# Patient Record
Sex: Female | Born: 1975 | Hispanic: Yes | Marital: Married | Smoking: Never smoker
Health system: Southern US, Community
[De-identification: ages and names within clinical notes are randomized; demographics above are authoritative.]

## PROBLEM LIST (undated history)

## (undated) ENCOUNTER — Inpatient Hospital Stay (HOSPITAL_COMMUNITY): Payer: Self-pay

## (undated) DIAGNOSIS — E039 Hypothyroidism, unspecified: Secondary | ICD-10-CM

---

## 1999-05-10 ENCOUNTER — Ambulatory Visit (HOSPITAL_COMMUNITY): Admission: RE | Admit: 1999-05-10 | Discharge: 1999-05-10 | Payer: Self-pay | Admitting: *Deleted

## 1999-09-18 ENCOUNTER — Encounter: Payer: Self-pay | Admitting: *Deleted

## 1999-09-18 ENCOUNTER — Inpatient Hospital Stay (HOSPITAL_COMMUNITY): Admission: AD | Admit: 1999-09-18 | Discharge: 1999-09-23 | Payer: Self-pay | Admitting: *Deleted

## 2000-03-03 ENCOUNTER — Encounter (INDEPENDENT_AMBULATORY_CARE_PROVIDER_SITE_OTHER): Payer: Self-pay

## 2000-03-03 ENCOUNTER — Other Ambulatory Visit: Admission: RE | Admit: 2000-03-03 | Discharge: 2000-03-03 | Payer: Self-pay | Admitting: Obstetrics

## 2000-07-07 ENCOUNTER — Encounter: Admission: RE | Admit: 2000-07-07 | Discharge: 2000-07-07 | Payer: Self-pay | Admitting: Obstetrics & Gynecology

## 2000-08-04 ENCOUNTER — Other Ambulatory Visit: Admission: RE | Admit: 2000-08-04 | Discharge: 2000-08-04 | Payer: Self-pay | Admitting: *Deleted

## 2000-08-04 ENCOUNTER — Encounter: Admission: RE | Admit: 2000-08-04 | Discharge: 2000-08-04 | Payer: Self-pay | Admitting: Obstetrics

## 2000-08-18 ENCOUNTER — Encounter: Admission: RE | Admit: 2000-08-18 | Discharge: 2000-08-18 | Payer: Self-pay | Admitting: Obstetrics & Gynecology

## 2000-09-15 ENCOUNTER — Encounter: Admission: RE | Admit: 2000-09-15 | Discharge: 2000-09-15 | Payer: Self-pay | Admitting: Obstetrics & Gynecology

## 2000-12-29 ENCOUNTER — Encounter: Admission: RE | Admit: 2000-12-29 | Discharge: 2000-12-29 | Payer: Self-pay | Admitting: Obstetrics & Gynecology

## 2002-08-26 ENCOUNTER — Encounter: Payer: Self-pay | Admitting: Family Medicine

## 2002-08-26 ENCOUNTER — Ambulatory Visit (HOSPITAL_COMMUNITY): Admission: RE | Admit: 2002-08-26 | Discharge: 2002-08-26 | Payer: Self-pay | Admitting: Family Medicine

## 2003-04-04 ENCOUNTER — Ambulatory Visit (HOSPITAL_COMMUNITY): Admission: RE | Admit: 2003-04-04 | Discharge: 2003-04-04 | Payer: Self-pay | Admitting: Obstetrics and Gynecology

## 2003-09-15 ENCOUNTER — Ambulatory Visit (HOSPITAL_COMMUNITY): Admission: RE | Admit: 2003-09-15 | Discharge: 2003-09-15 | Payer: Self-pay | Admitting: Family Medicine

## 2004-11-16 ENCOUNTER — Inpatient Hospital Stay (HOSPITAL_COMMUNITY): Admission: AD | Admit: 2004-11-16 | Discharge: 2004-11-18 | Payer: Self-pay | Admitting: Obstetrics

## 2009-05-18 ENCOUNTER — Ambulatory Visit (HOSPITAL_COMMUNITY): Admission: RE | Admit: 2009-05-18 | Discharge: 2009-05-18 | Payer: Self-pay | Admitting: Obstetrics & Gynecology

## 2009-06-08 ENCOUNTER — Ambulatory Visit (HOSPITAL_COMMUNITY): Admission: RE | Admit: 2009-06-08 | Discharge: 2009-06-08 | Payer: Self-pay | Admitting: Obstetrics & Gynecology

## 2009-06-18 ENCOUNTER — Ambulatory Visit: Payer: Self-pay | Admitting: Obstetrics and Gynecology

## 2009-06-22 ENCOUNTER — Encounter: Admission: RE | Admit: 2009-06-22 | Discharge: 2009-09-20 | Payer: Self-pay | Admitting: Obstetrics and Gynecology

## 2009-06-22 ENCOUNTER — Ambulatory Visit: Payer: Self-pay | Admitting: Obstetrics & Gynecology

## 2009-06-29 ENCOUNTER — Ambulatory Visit: Payer: Self-pay | Admitting: Obstetrics & Gynecology

## 2009-07-06 ENCOUNTER — Ambulatory Visit: Payer: Self-pay | Admitting: Obstetrics & Gynecology

## 2009-07-13 ENCOUNTER — Ambulatory Visit: Payer: Self-pay | Admitting: Obstetrics & Gynecology

## 2009-07-20 ENCOUNTER — Ambulatory Visit: Payer: Self-pay | Admitting: Obstetrics & Gynecology

## 2009-07-27 ENCOUNTER — Ambulatory Visit: Payer: Self-pay | Admitting: Obstetrics & Gynecology

## 2009-07-27 LAB — CONVERTED CEMR LAB
Hemoglobin: 11.1 g/dL — ABNORMAL LOW (ref 12.0–15.0)
RBC: 3.93 M/uL (ref 3.87–5.11)
RDW: 14.3 % (ref 11.5–15.5)
WBC: 9.8 10*3/uL (ref 4.0–10.5)

## 2009-08-03 ENCOUNTER — Ambulatory Visit: Payer: Self-pay | Admitting: Obstetrics & Gynecology

## 2009-08-04 ENCOUNTER — Ambulatory Visit (HOSPITAL_COMMUNITY): Admission: RE | Admit: 2009-08-04 | Discharge: 2009-08-04 | Payer: Self-pay

## 2009-08-10 ENCOUNTER — Ambulatory Visit: Payer: Self-pay | Admitting: Obstetrics & Gynecology

## 2009-08-17 ENCOUNTER — Ambulatory Visit: Payer: Self-pay | Admitting: Obstetrics & Gynecology

## 2009-08-24 ENCOUNTER — Ambulatory Visit: Payer: Self-pay | Admitting: Obstetrics & Gynecology

## 2009-08-31 ENCOUNTER — Ambulatory Visit: Payer: Self-pay | Admitting: Obstetrics & Gynecology

## 2009-09-03 ENCOUNTER — Ambulatory Visit: Payer: Self-pay | Admitting: Obstetrics & Gynecology

## 2009-09-07 ENCOUNTER — Ambulatory Visit: Payer: Self-pay | Admitting: Obstetrics and Gynecology

## 2009-09-10 ENCOUNTER — Ambulatory Visit: Payer: Self-pay | Admitting: Obstetrics & Gynecology

## 2009-09-14 ENCOUNTER — Ambulatory Visit: Payer: Self-pay | Admitting: Obstetrics & Gynecology

## 2009-09-14 ENCOUNTER — Ambulatory Visit (HOSPITAL_COMMUNITY): Admission: RE | Admit: 2009-09-14 | Discharge: 2009-09-14 | Payer: Self-pay | Admitting: Obstetrics & Gynecology

## 2009-09-17 ENCOUNTER — Ambulatory Visit: Payer: Self-pay | Admitting: Obstetrics & Gynecology

## 2009-09-22 ENCOUNTER — Ambulatory Visit: Payer: Self-pay | Admitting: Obstetrics & Gynecology

## 2009-09-24 ENCOUNTER — Ambulatory Visit: Payer: Self-pay | Admitting: Obstetrics & Gynecology

## 2009-09-28 ENCOUNTER — Encounter: Admission: RE | Admit: 2009-09-28 | Discharge: 2009-09-28 | Payer: Self-pay | Admitting: Obstetrics & Gynecology

## 2009-09-28 ENCOUNTER — Encounter (INDEPENDENT_AMBULATORY_CARE_PROVIDER_SITE_OTHER): Payer: Self-pay | Admitting: *Deleted

## 2009-09-28 ENCOUNTER — Ambulatory Visit: Payer: Self-pay | Admitting: Obstetrics & Gynecology

## 2009-09-28 LAB — CONVERTED CEMR LAB
Chlamydia, DNA Probe: NEGATIVE
GC Probe Amp, Genital: NEGATIVE

## 2009-10-01 ENCOUNTER — Ambulatory Visit: Payer: Self-pay | Admitting: Obstetrics and Gynecology

## 2009-10-05 ENCOUNTER — Ambulatory Visit: Payer: Self-pay | Admitting: Obstetrics & Gynecology

## 2009-10-05 ENCOUNTER — Ambulatory Visit (HOSPITAL_COMMUNITY): Admission: RE | Admit: 2009-10-05 | Discharge: 2009-10-05 | Payer: Self-pay | Admitting: Obstetrics & Gynecology

## 2009-10-08 ENCOUNTER — Ambulatory Visit: Payer: Self-pay | Admitting: Obstetrics & Gynecology

## 2009-10-12 ENCOUNTER — Ambulatory Visit (HOSPITAL_COMMUNITY): Admission: RE | Admit: 2009-10-12 | Discharge: 2009-10-12 | Payer: Self-pay | Admitting: Family Medicine

## 2009-10-12 ENCOUNTER — Ambulatory Visit: Payer: Self-pay | Admitting: Obstetrics & Gynecology

## 2009-10-13 ENCOUNTER — Ambulatory Visit: Payer: Self-pay | Admitting: Family Medicine

## 2009-10-13 ENCOUNTER — Inpatient Hospital Stay (HOSPITAL_COMMUNITY): Admission: RE | Admit: 2009-10-13 | Discharge: 2009-10-14 | Payer: Self-pay | Admitting: Obstetrics and Gynecology

## 2009-11-23 ENCOUNTER — Encounter (INDEPENDENT_AMBULATORY_CARE_PROVIDER_SITE_OTHER): Payer: Self-pay | Admitting: Nurse Practitioner

## 2010-05-16 ENCOUNTER — Encounter: Payer: Self-pay | Admitting: Family Medicine

## 2010-07-12 LAB — CBC
HCT: 31.6 % — ABNORMAL LOW (ref 36.0–46.0)
HCT: 36.3 % (ref 36.0–46.0)
Hemoglobin: 11.1 g/dL — ABNORMAL LOW (ref 12.0–15.0)
MCHC: 35.2 g/dL (ref 30.0–36.0)
MCV: 87 fL (ref 78.0–100.0)
RBC: 3.64 MIL/uL — ABNORMAL LOW (ref 3.87–5.11)
RBC: 4.22 MIL/uL (ref 3.87–5.11)
RDW: 14.8 % (ref 11.5–15.5)
WBC: 10.8 10*3/uL — ABNORMAL HIGH (ref 4.0–10.5)
WBC: 13.5 10*3/uL — ABNORMAL HIGH (ref 4.0–10.5)

## 2010-07-12 LAB — POCT URINALYSIS DIP (DEVICE)
Bilirubin Urine: NEGATIVE
Bilirubin Urine: NEGATIVE
Bilirubin Urine: NEGATIVE
Hgb urine dipstick: NEGATIVE
Hgb urine dipstick: NEGATIVE
Hgb urine dipstick: NEGATIVE
Ketones, ur: NEGATIVE mg/dL
Ketones, ur: NEGATIVE mg/dL
Ketones, ur: NEGATIVE mg/dL
Nitrite: NEGATIVE
Nitrite: NEGATIVE
Nitrite: NEGATIVE
Nitrite: NEGATIVE
Nitrite: NEGATIVE
Protein, ur: 30 mg/dL — AB
Protein, ur: NEGATIVE mg/dL
Protein, ur: NEGATIVE mg/dL
Protein, ur: NEGATIVE mg/dL
Specific Gravity, Urine: 1.015 (ref 1.005–1.030)
Specific Gravity, Urine: 1.025 (ref 1.005–1.030)
Specific Gravity, Urine: 1.02 (ref 1.005–1.030)
Urobilinogen, UA: 0.2 mg/dL (ref 0.0–1.0)
Urobilinogen, UA: 0.2 mg/dL (ref 0.0–1.0)
Urobilinogen, UA: 0.2 mg/dL (ref 0.0–1.0)
Urobilinogen, UA: 0.2 mg/dL (ref 0.0–1.0)
pH: 7 (ref 5.0–8.0)
pH: 7 (ref 5.0–8.0)

## 2010-07-12 LAB — GLUCOSE, CAPILLARY
Glucose-Capillary: 87 mg/dL (ref 70–99)
Glucose-Capillary: 92 mg/dL (ref 70–99)

## 2010-07-13 LAB — POCT URINALYSIS DIP (DEVICE)
Bilirubin Urine: NEGATIVE
Bilirubin Urine: NEGATIVE
Glucose, UA: NEGATIVE mg/dL
Glucose, UA: NEGATIVE mg/dL
Hgb urine dipstick: NEGATIVE
Ketones, ur: NEGATIVE mg/dL
Ketones, ur: NEGATIVE mg/dL
Nitrite: NEGATIVE
Nitrite: NEGATIVE
Nitrite: NEGATIVE
Protein, ur: NEGATIVE mg/dL
Protein, ur: NEGATIVE mg/dL
Specific Gravity, Urine: 1.025 (ref 1.005–1.030)
Specific Gravity, Urine: 1.02 (ref 1.005–1.030)
Urobilinogen, UA: 0.2 mg/dL (ref 0.0–1.0)
Urobilinogen, UA: 0.2 mg/dL (ref 0.0–1.0)
Urobilinogen, UA: 0.2 mg/dL (ref 0.0–1.0)
pH: 6.5 (ref 5.0–8.0)
pH: 6.5 (ref 5.0–8.0)
pH: 7 (ref 5.0–8.0)

## 2010-07-14 LAB — POCT URINALYSIS DIP (DEVICE)
Bilirubin Urine: NEGATIVE
Glucose, UA: NEGATIVE mg/dL
Nitrite: NEGATIVE
Nitrite: NEGATIVE
Protein, ur: NEGATIVE mg/dL
Urobilinogen, UA: 0.2 mg/dL (ref 0.0–1.0)
pH: 7 (ref 5.0–8.0)

## 2010-07-16 LAB — POCT URINALYSIS DIP (DEVICE)
Bilirubin Urine: NEGATIVE
Bilirubin Urine: NEGATIVE
Glucose, UA: NEGATIVE mg/dL
Hgb urine dipstick: NEGATIVE
Protein, ur: 30 mg/dL — AB
Specific Gravity, Urine: 1.015 (ref 1.005–1.030)
Urobilinogen, UA: 0.2 mg/dL (ref 0.0–1.0)
Urobilinogen, UA: 0.2 mg/dL (ref 0.0–1.0)
pH: 7 (ref 5.0–8.0)

## 2010-07-19 LAB — POCT URINALYSIS DIP (DEVICE)
Bilirubin Urine: NEGATIVE
Bilirubin Urine: NEGATIVE
Glucose, UA: NEGATIVE mg/dL
Glucose, UA: NEGATIVE mg/dL
Hgb urine dipstick: NEGATIVE
Ketones, ur: NEGATIVE mg/dL
Nitrite: NEGATIVE
Nitrite: NEGATIVE
Protein, ur: NEGATIVE mg/dL
Protein, ur: NEGATIVE mg/dL
Specific Gravity, Urine: 1.02 (ref 1.005–1.030)

## 2010-07-21 ENCOUNTER — Encounter: Payer: Self-pay | Admitting: Nurse Practitioner

## 2010-07-21 ENCOUNTER — Encounter (INDEPENDENT_AMBULATORY_CARE_PROVIDER_SITE_OTHER): Payer: Self-pay | Admitting: Nurse Practitioner

## 2010-07-21 DIAGNOSIS — D179 Benign lipomatous neoplasm, unspecified: Secondary | ICD-10-CM | POA: Insufficient documentation

## 2010-07-21 DIAGNOSIS — K029 Dental caries, unspecified: Secondary | ICD-10-CM | POA: Insufficient documentation

## 2010-07-21 DIAGNOSIS — K649 Unspecified hemorrhoids: Secondary | ICD-10-CM | POA: Insufficient documentation

## 2010-07-27 NOTE — Assessment & Plan Note (Signed)
Summary: NEW - Establish care   Vital Signs:  Patient profile:   35 year old female Menstrual status:  regular LMP:     07/04/2010 Height:      61.5 inches Weight:      181.19 pounds BMI:     33.80 Temp:     98.8 degrees F oral Pulse rate:   83 / minute Pulse rhythm:   regular Resp:     18 per minute BP sitting:   121 / 78  (left arm) Cuff size:   regular  Vitals Entered By: Hale Drone CMA (July 21, 2010 3:43 PM)  Nutrition Counseling: Patient's BMI is greater than 25 and therefore counseled on weight management options. CC: Tooth pain x3 months... Needing a dental referrla... Lump between left shoulder and neck and is concerned... No pain associated... 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 Is Patient Diabetic? No Pain Assessment Patient in pain? yes       Does patient need assistance? Functional Status Self care Ambulation Normal LMP (date): 07/04/2010     Menstrual Status regular Enter LMP: 07/04/2010 Last PAP Result done at [facility]   CC:  Tooth pain x3 months... Needing a dental referrla... Lump between left shoulder and neck and is concerned... No pain associated... 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  History of Present Illness:  Pt into the office to establish care. No previous PCP No PMH   Dental problem - started 3 months ago Pt has gargled with alcohol to help with the pain Problem in in the upper left tooth which had a filing but it spontaneously came out Last dental appt was several years ago.   Pt is still breast feeding so she has not taken any meds by mouth  Habits & Providers  Alcohol-Tobacco-Diet     Alcohol drinks/day: 0     Tobacco Status: never  Exercise-Depression-Behavior     Does Patient Exercise: no     Have you felt down or hopeless? no     Have you felt little pleasure in things? no     Depression Counseling: not indicated; screening negative for depression     Drug Use: never  Current Medications (verified): 1)   None  Allergies (verified): No Known Drug Allergies  Social History: 3 children  living with boyfriend no tobacco  no ETOH  no drug useSmoking Status:  never Drug Use:  never Does Patient Exercise:  no  Review of Systems General:  Denies fever. CV:  Denies chest pain or discomfort. Resp:  Denies cough. GI:  Complains of constipation and hemorrhoids; denies abdominal pain, nausea, and vomiting. MS:  Denies joint pain. Derm:  area on back of neck present for many years. not getting larger pt just concerned about the dx.  Physical Exam  General:  alert.   Head:  normocephalic.   Mouth:  poor dentation - left upper dental caries Lungs:  normal breath sounds.   Heart:  normal rate and regular rhythm.   Abdomen:  normal bowel sounds.   Msk:  normal ROM.   Neurologic:  alert & oriented X3.   Skin:  color normal.   posterior neck with lipoma Psych:  Oriented X3.     Impression & Recommendations:  Problem # 1:  DENTAL CARIES (ICD-521.00) advised pt that dental clinic is not taking any referrals at this time will give amoxil and ibuprofen  pt instructed to find local dental care  Problem # 2:  HEMORRHOIDS (ICD-455.6) handout given advised pt  to eat more fiber rich foods  Problem # 3:  LIPOMA (ICD-214.9) handout given pt advised of dx  Complete Medication List: 1)  Yasmin 28 3-0.03 Mg Tabs (Drospirenone-ethinyl estradiol) .... Rx by [facility] 2)  Amoxicillin 500 Mg Caps (Amoxicillin) .... One capsule by mouth three times a day for infection 3)  Ibuprofen 800 Mg Tabs (Ibuprofen) .... One tablet by mouth two times a day as needed for pain  Patient Instructions: 1)  You will need to secure dental services. Our dental clinic is NOT accepting patients at this time.   2)  will treat with amoxil 500mg  by mouth three times a day for infection.  May take ibuprofen 800mg  by mouth two times a day as needed for pain.  Both should be safe in pregnancy. 3)  Need to use condoms while  taking antibiotics 4)  Lipoma - fat tissue on back of your neck 5)  Hemorrhoids - You need to eat high fiber diet to help with hemorrhoids.  You need to keep your stools soft Prescriptions: IBUPROFEN 800 MG TABS (IBUPROFEN) One tablet by mouth two times a day as needed for pain  #50 x 0   Entered and Authorized by:   Lehman Prom FNP   Signed by:   Lehman Prom FNP on 07/21/2010   Method used:   Print then Give to Patient   RxID:   [id number] AMOXICILLIN 500 MG CAPS (AMOXICILLIN) One capsule by mouth three times a day for infection  #30 x 0   Entered and Authorized by:   Lehman Prom FNP   Signed by:   Lehman Prom FNP on 07/21/2010   Method used:   Print then Give to Patient   RxID:   [id number]    Orders Added: 1)  New Patient Level III [40102]

## 2010-07-27 NOTE — Letter (Signed)
Summary: Handout Printed  Printed Handout:  - Lipoma (Fatty Tumor)

## 2010-07-27 NOTE — Letter (Signed)
Summary: Handout Printed  Printed Handout:  - Diet - High-Fiber 

## 2010-07-27 NOTE — Letter (Signed)
Summary: Handout Printed  Printed Handout:  - Hemorrhoids 

## 2010-08-15 IMAGING — US US OB FOLLOW-UP
1 series · 14 of 28 positions shown · non-contrast
Comparison: none

OBSTETRICAL ULTRASOUND:
 This ultrasound exam was performed in the [HOSPITAL] Ultrasound Department.  The OB US report was generated in the AS system, and faxed to the ordering physician.  This report is also available in [HOSPITAL]?s AccessANYware and in [REDACTED] PACS.

[Series 1: us ob follow up · 0.24mm/px · 14 of 39 slices shown]
[im 2/39]
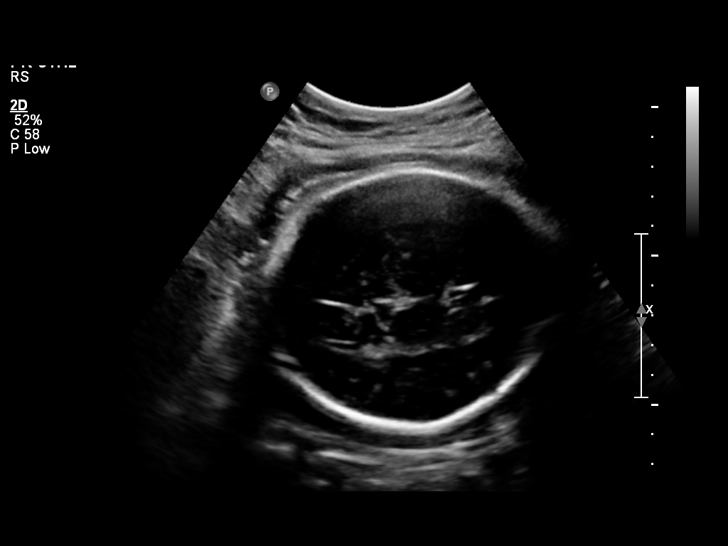
[im 5/39]
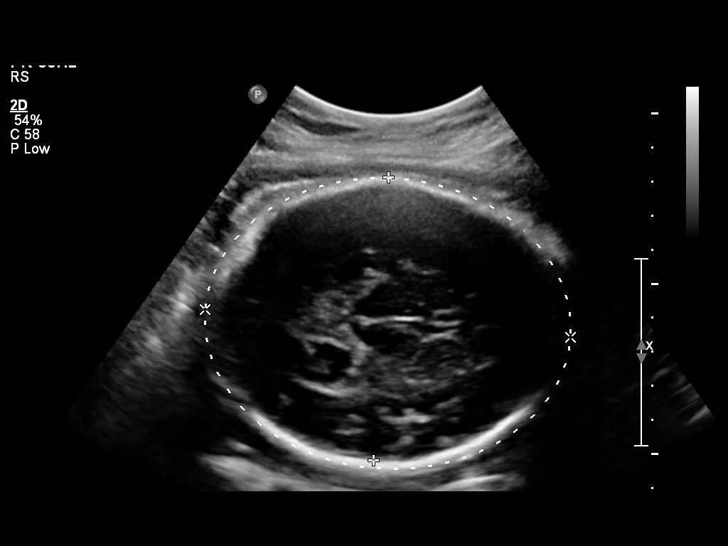
[im 8/39]
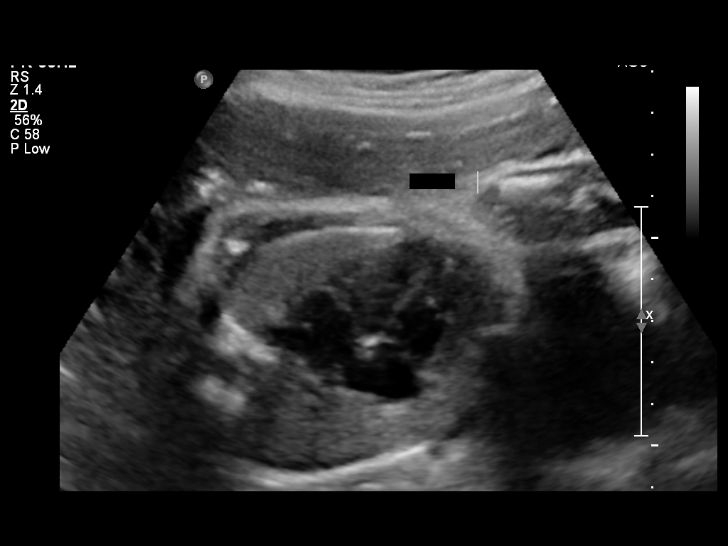
[im 10/39]
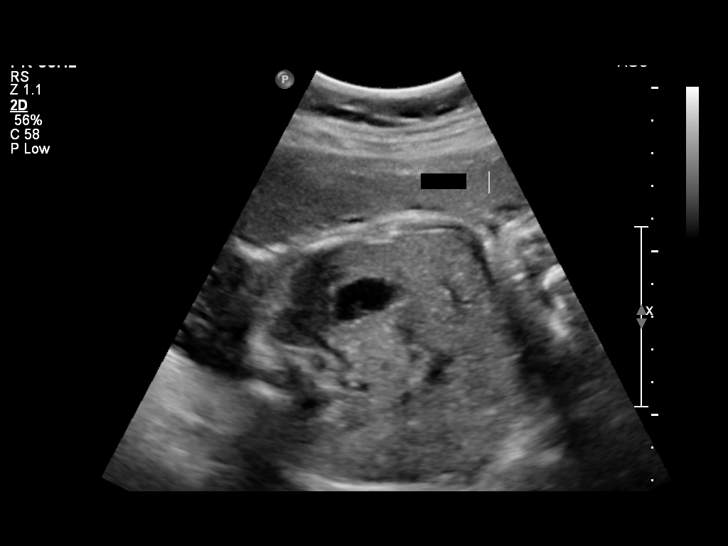
[im 13/39]
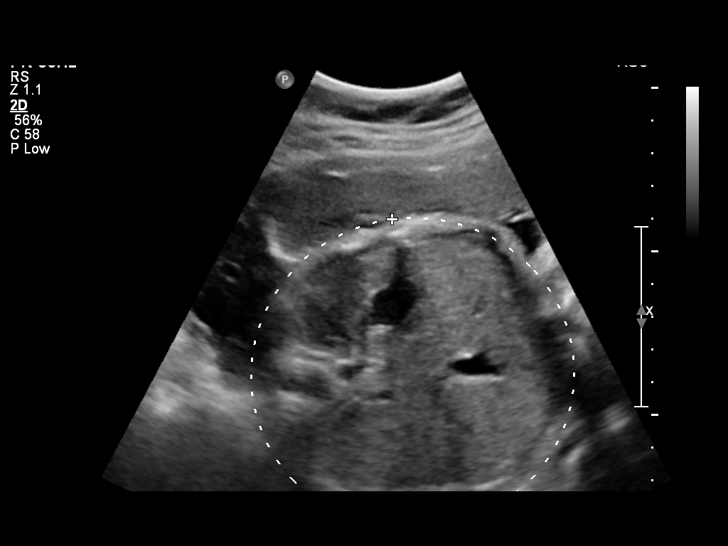
[im 16/39]
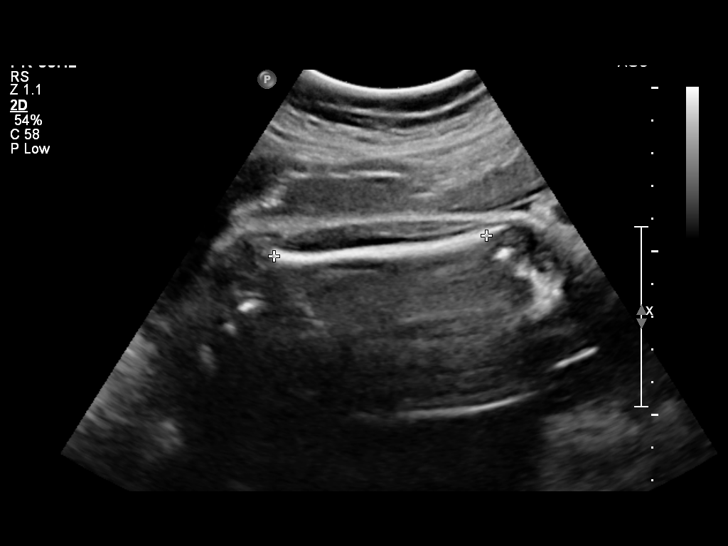
[im 19/39]
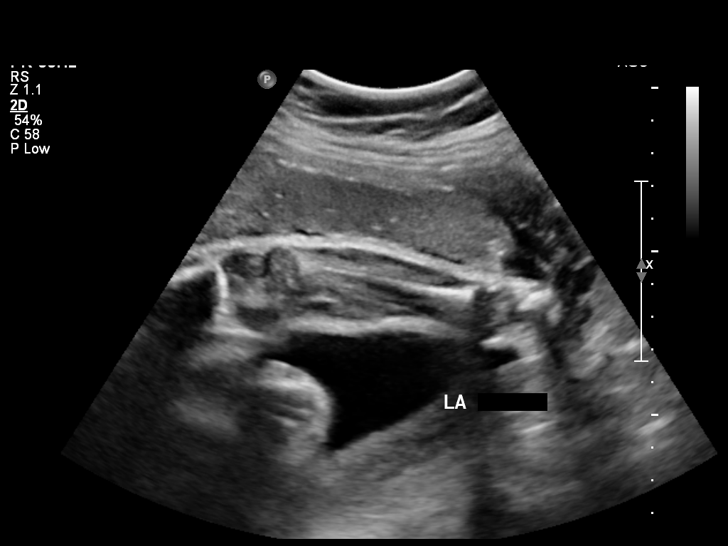
[im 22/39]
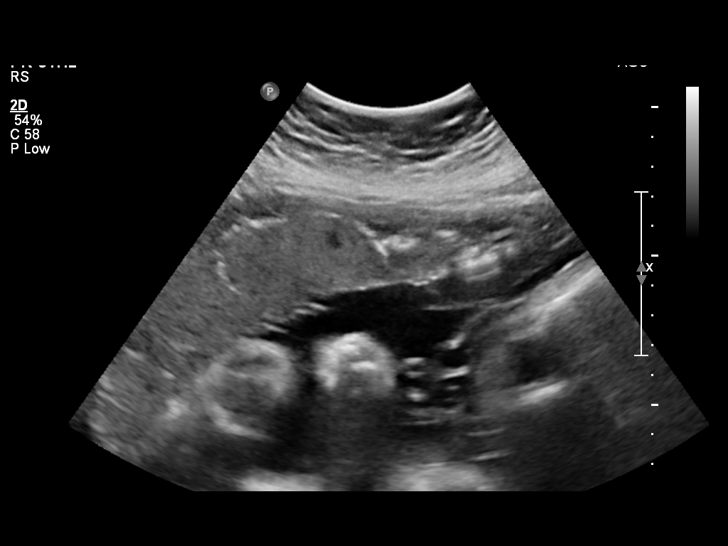
[im 24/39]
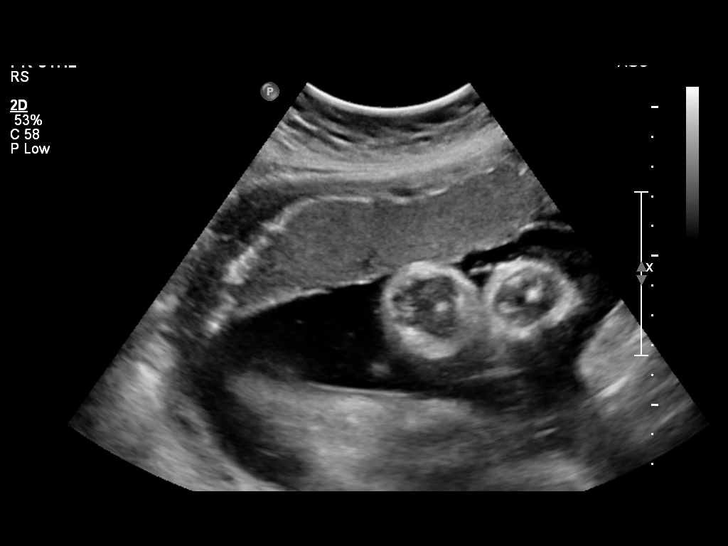
[im 27/39]
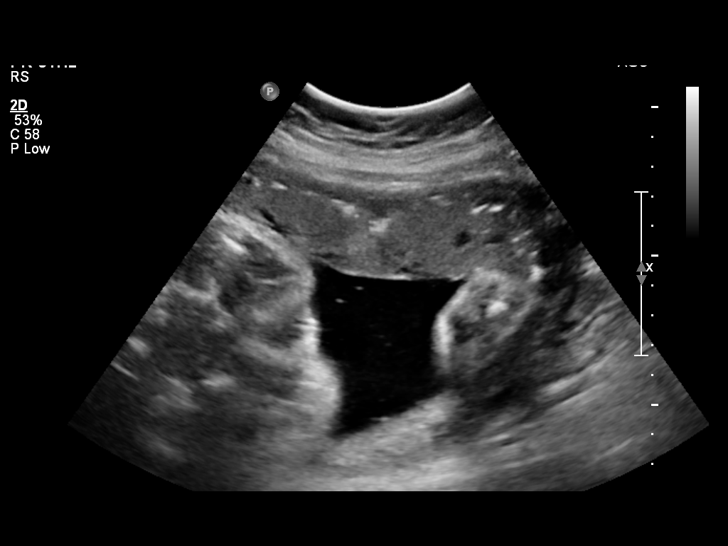
[im 30/39]
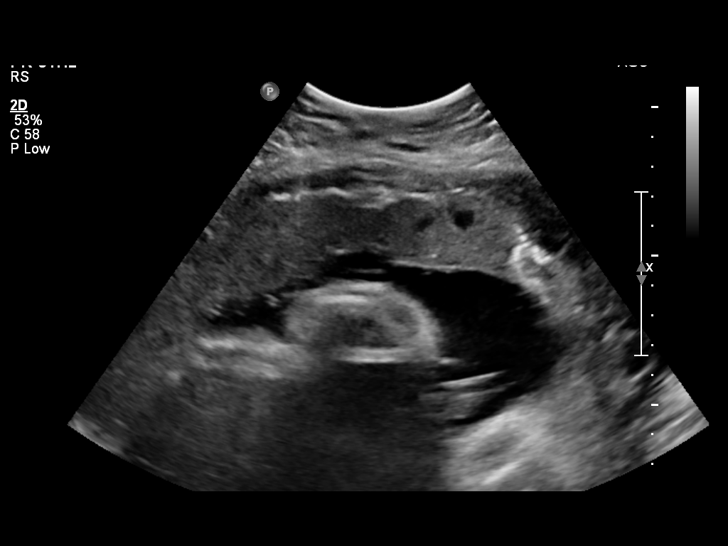
[im 33/39]
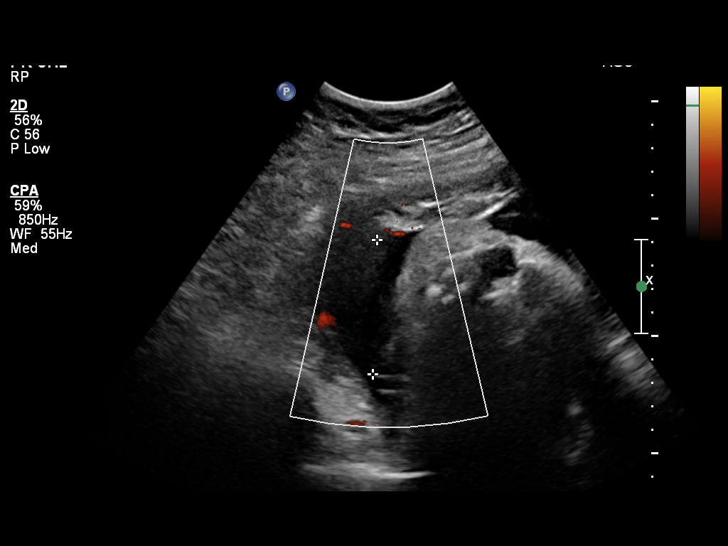
[im 36/39]
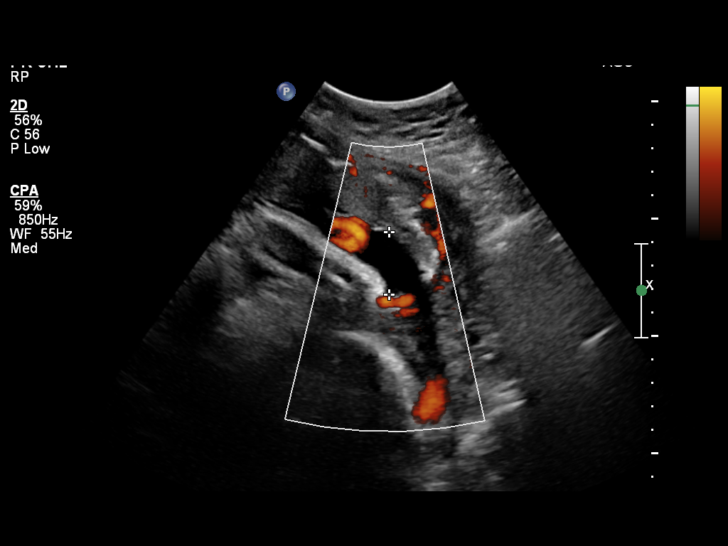
[im 39/39]
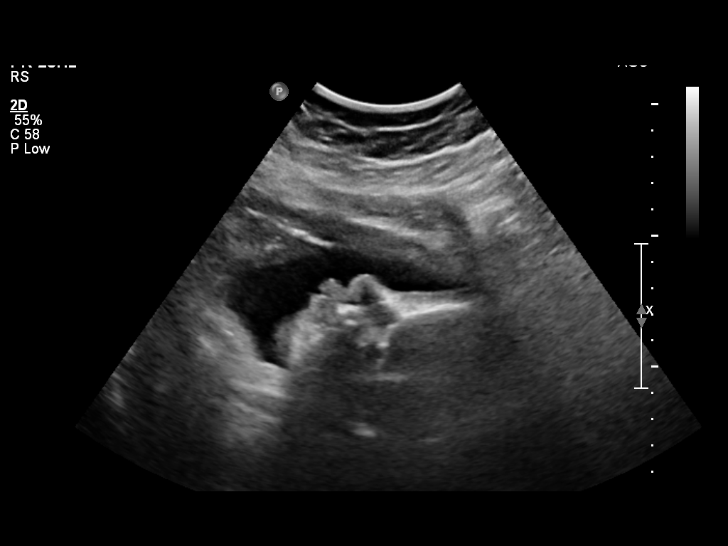

[14 of 28 positions shown; findings below may reference images not displayed]

IMPRESSION: See AS Obstetric US report.

## 2010-09-10 NOTE — Op Note (Signed)
Bianca Odonnell, Bianca Odonnell           ACCOUNT NO.:  [id number]   MEDICAL RECORD NO.:  [id number]          PATIENT TYPE:  INP   LOCATION:  9106                          FACILITY:  [facility]   PHYSICIAN:  Kathreen Cosier, M.D.DATE OF BIRTH:  1975/05/11   DATE OF PROCEDURE:  11/16/2004  DATE OF DISCHARGE:                                 OPERATIVE REPORT   PREOPERATIVE DIAGNOSIS:  Breech presentation at term, in labor, fully  dilated.   SURGEON:  Kathreen Cosier, M.D.   ANESTHESIA:  Spinal.   DESCRIPTION OF PROCEDURE:  The patient placed on the operating room in the  supine position after spinal was administered.  Abdomen prepped and draped.  Bladder emptied with Foley catheter.  Transverse suprapubic incision made,  carried down through the rectus fascia.  The fascia was cleaned and incised  the length of the incision.  Rectus muscle were retracted laterally.  The  peritoneum was incised longitudinally.  Transverse incision made in the  vesicouterine peritoneum above the bladder.  The bladder mobilized  inferiorly.  Transverse lower uterine incision made.  The patient was  delivered of a frank breech female, Apgars 8 and 0, weighing 7 pounds 3  ounces.  The placenta was posterior and removed manually. Uterine cavity was  cleaned with dry laps.  The team was in attendance.  The lap and sponge  counts were correct. Uterine cavity closed in one layer with continuous  suture of #1 chromic.  Hemostasis satisfactory.  Bladder flap reattached  with 2-0 chromic.  Uterus was well contracted.  Tubes and ovaries normal.  Abdomen closed in layers.  Peritoneum with continuous suture of 0 chromic,  fascia with continuous suture of 0 Dexon and skin closed with stables.  Estimated blood loss 600 mL.       BAM/MEDQ  D:  11/16/2004  T:  11/16/2004  Job:  [id number]

## 2010-09-10 NOTE — H&P (Signed)
NAMEELENNA, SPRATLING           ACCOUNT NO.:  [id number]   MEDICAL RECORD NO.:  [id number]          PATIENT TYPE:  INP   LOCATION:  9106                          FACILITY:  [facility]   PHYSICIAN:  Kathreen Cosier, M.D.DATE OF BIRTH:  03/24/1976   DATE OF ADMISSION:  11/16/2004  DATE OF DISCHARGE:                                HISTORY & PHYSICAL   HISTORY OF PRESENT ILLNESS:  The patient is a 35 year old gravida 2, para 0-  1-0-1, [facility] November 23, 2004 with gestational diabetic followed at [facility] with diet.  Sugars are reported as being normal.  She has negative  GBS.  The patient was admitted to the hospital, fully dilated and described  in MAU as a vertex presentation with membranes intact.  The patient was  taken to labor and delivery and I was then called for delivery.  However, at  that time it was discovered she was a frank breech with membranes intact.   The patient was then taken to the operating room for delivery of cesarean  section.   PHYSICAL EXAMINATION:  GENERAL:  Well-developed female in labor.  HEENT: Negative.  LUNGS:  Clear.  HEART:  Regular rhythm.  No murmurs, no gallops.  BREASTS: No masses.  ABDOMEN:  Term.  PELVIC:  As described above.  EXTREMITIES:  Negative.       BAM/MEDQ  D:  11/16/2004  T:  11/16/2004  Job:  [id number]

## 2010-09-10 NOTE — Discharge Summary (Signed)
NAMEJAPLEEN, TORNOW           ACCOUNT NO.:  [id number]   MEDICAL RECORD NO.:  [id number]          PATIENT TYPE:  INP   LOCATION:  9106                          FACILITY:  [facility]   PHYSICIAN:  Kathreen Cosier, M.D.DATE OF BIRTH:  [birth year]   DATE OF ADMISSION:  11/16/2004  DATE OF DISCHARGE:  11/18/2004                                 DISCHARGE SUMMARY   The patient is a 35 year old gravida 2, para 0-1-0-1, [facility] November 23, 2004,  gestational diabetic followed at [facility] with diet.  Sugars were  reported to be normal, negative GBS.  She was admitted, fully dilated and  described as a vertex presentation.  The patient taken to labor and delivery  and she was discovered to be breech, fully dilated, at +2 station.  The  patient was taken to the operating room and delivered by low transverse  cesarean section.  She had a female frank breech, Apgar 8 and 9, weighing 7  pounds 3 ounces.  The team was in attendance.  Postoperatively she did well.  Her hemoglobin was 10.2.  The patient desired to go home on the second  postop day and was discharged on Tylox for pain, to see me in six weeks.   DISCHARGE DIAGNOSIS:  Status post primary low transverse cesarean section at  term because of breech presentation, in active labor.       BAM/MEDQ  D:  11/18/2004  T:  11/18/2004  Job:  [id number]

## 2010-09-12 IMAGING — US US FETAL BPP W/O NONSTRESS
1 series · 5 of 5 positions shown · non-contrast
Comparison: none

OBSTETRICAL ULTRASOUND:
 This ultrasound exam was performed in the [HOSPITAL] Ultrasound Department.  The OB US report was generated in the AS system, and faxed to the ordering physician.  This report is also available in [HOSPITAL]?s AccessANYware and in [REDACTED] PACS.

[Series 1: us fetal bpp w/o nonstress · non-contrast · 0.28mm/px · 5 of 5 slices shown]
[im 1/5]
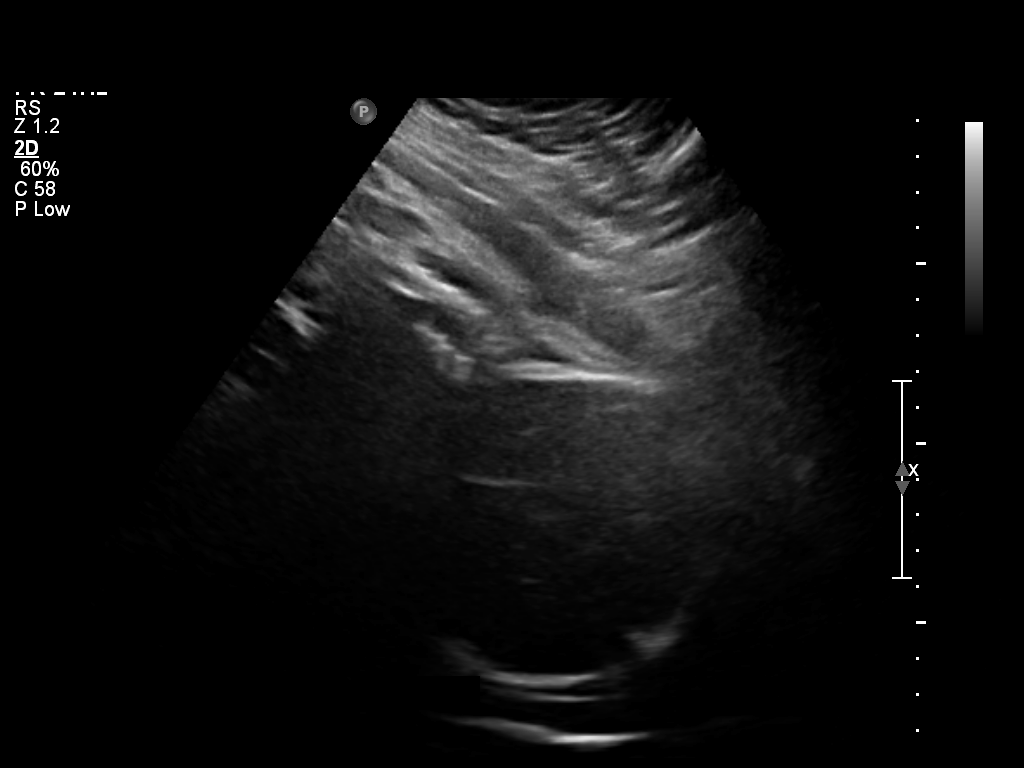
[im 2/5]
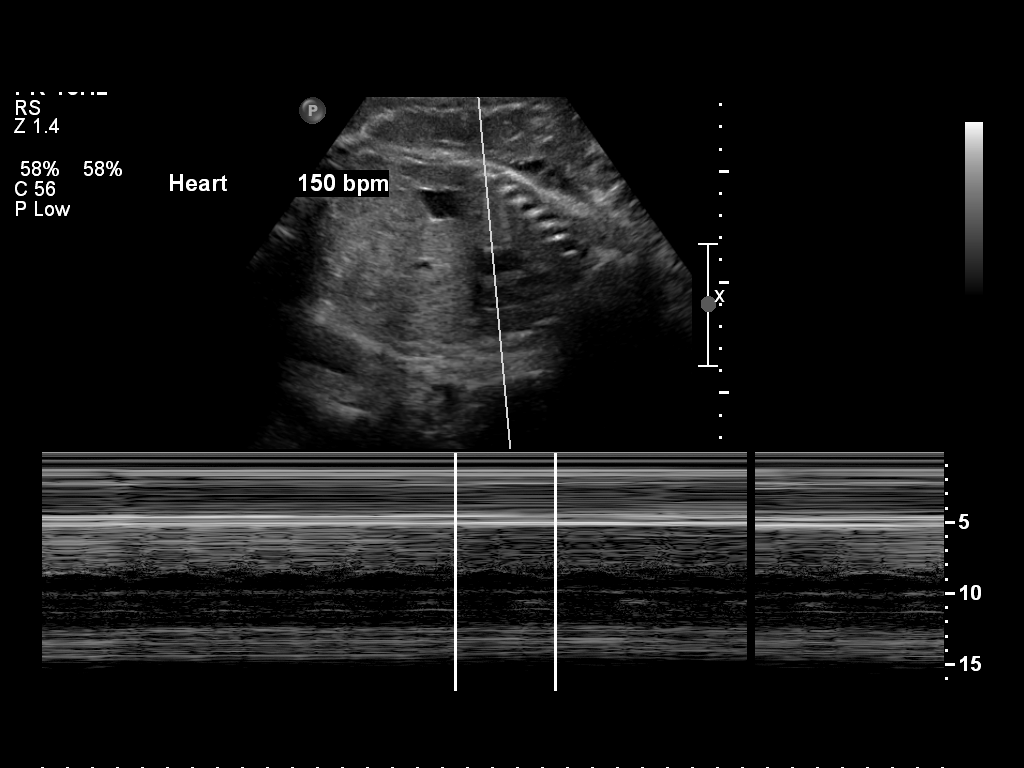
[im 3/5]
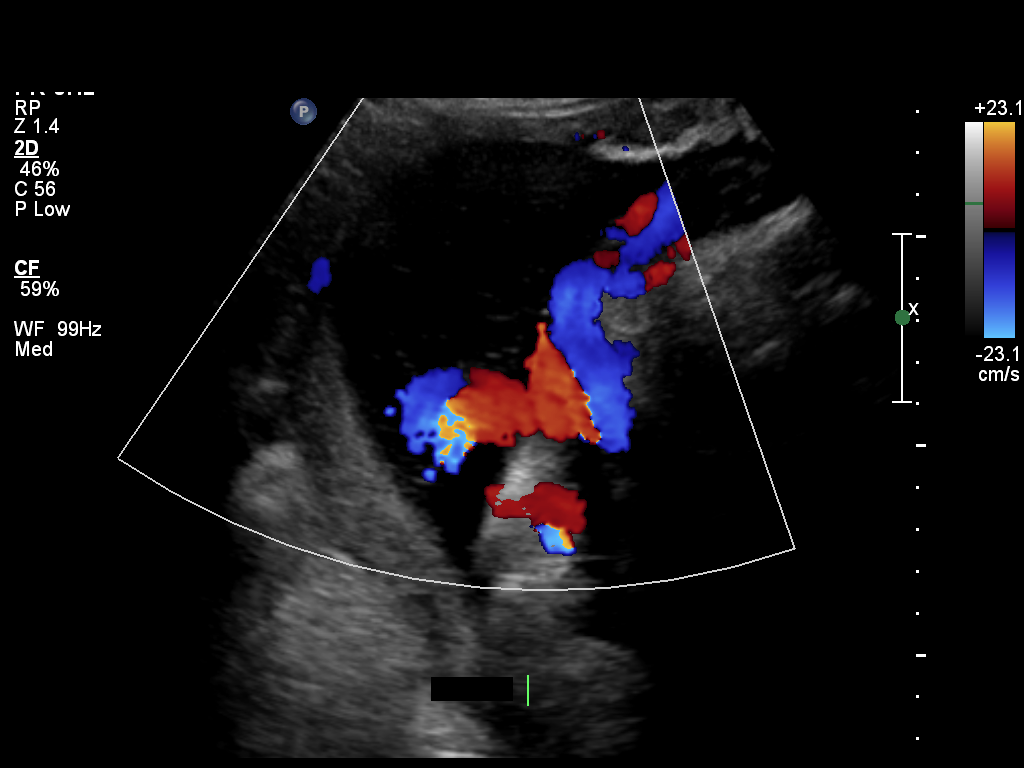
[im 4/5]
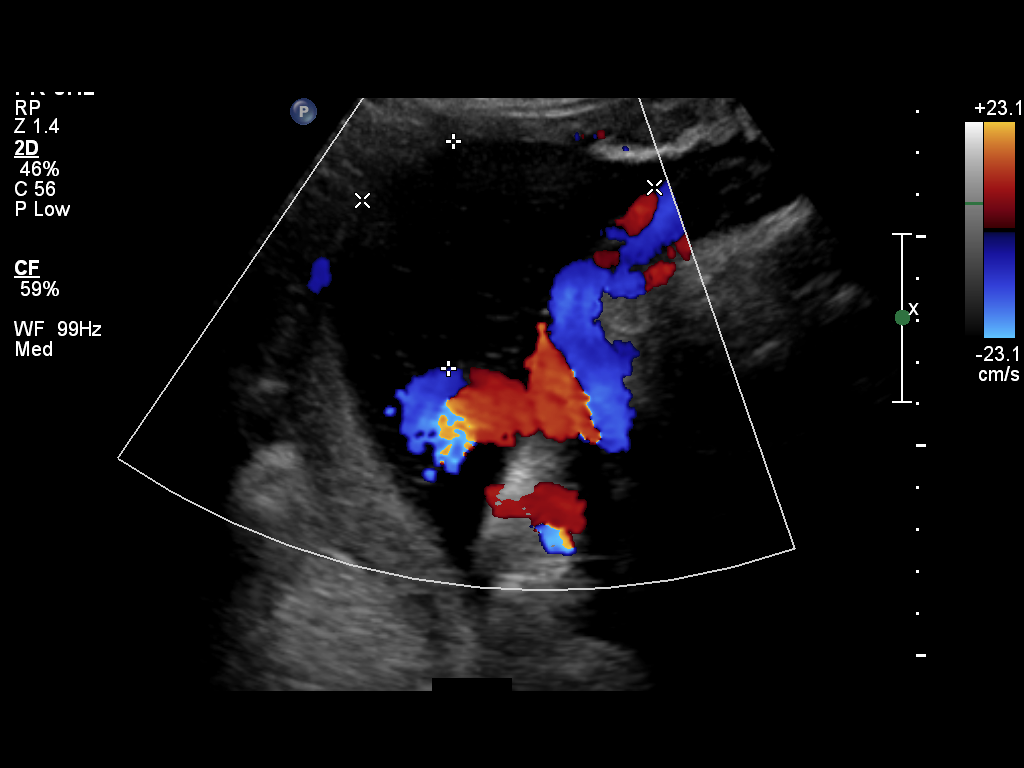
[im 5/5]
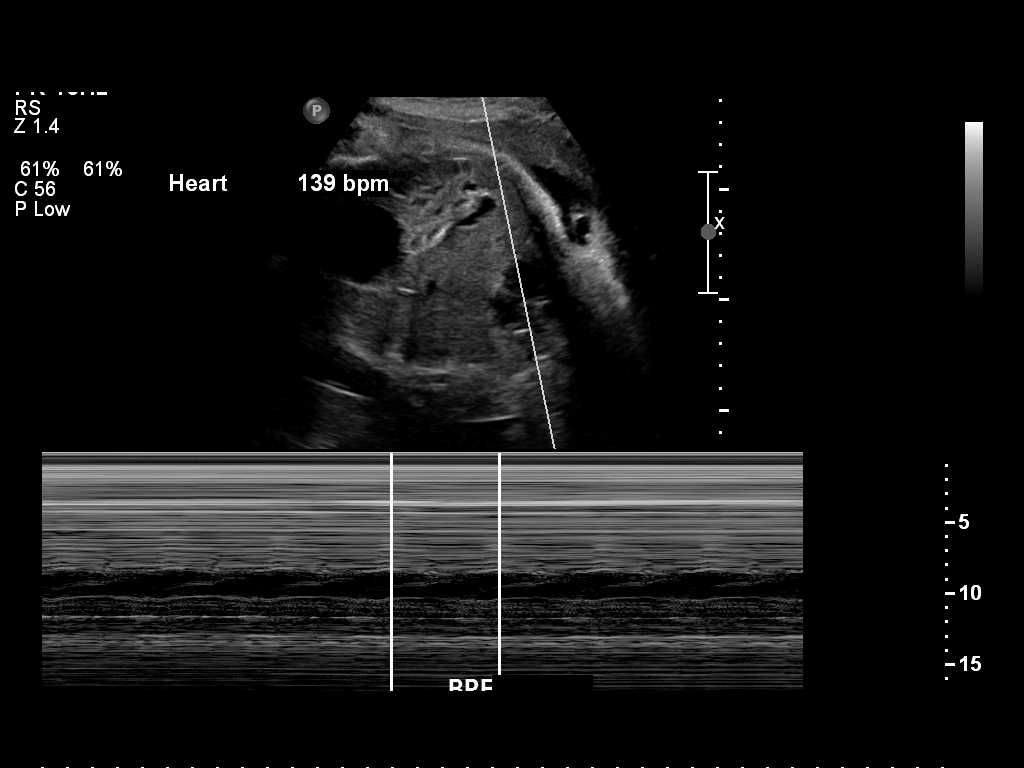

[5 of 5 positions shown; findings below may reference images not displayed]

IMPRESSION: See AS Obstetric US report.

## 2010-11-24 ENCOUNTER — Encounter (INDEPENDENT_AMBULATORY_CARE_PROVIDER_SITE_OTHER): Payer: Self-pay | Admitting: Nurse Practitioner

## 2011-12-09 ENCOUNTER — Ambulatory Visit: Payer: Self-pay | Admitting: Emergency Medicine

## 2011-12-09 ENCOUNTER — Ambulatory Visit: Payer: Self-pay

## 2011-12-09 VITALS — BP 124/82 | HR 80 | Temp 98.7°F | Resp 16

## 2011-12-09 DIAGNOSIS — IMO0002 Reserved for concepts with insufficient information to code with codable children: Secondary | ICD-10-CM

## 2011-12-09 DIAGNOSIS — Z23 Encounter for immunization: Secondary | ICD-10-CM

## 2011-12-09 DIAGNOSIS — T148XXA Other injury of unspecified body region, initial encounter: Secondary | ICD-10-CM

## 2011-12-09 DIAGNOSIS — S62639B Displaced fracture of distal phalanx of unspecified finger, initial encounter for open fracture: Secondary | ICD-10-CM

## 2011-12-09 DIAGNOSIS — M79644 Pain in right finger(s): Secondary | ICD-10-CM

## 2011-12-09 DIAGNOSIS — M79609 Pain in unspecified limb: Secondary | ICD-10-CM

## 2011-12-09 MED ORDER — HYDROCODONE-ACETAMINOPHEN 5-325 MG PO TABS: 1.0000 | ORAL_TABLET | Freq: Four times a day (QID) | ORAL | Status: AC | PRN

## 2011-12-09 MED ORDER — TETANUS-DIPHTH-ACELL PERTUSSIS 5-2.5-18.5 LF-MCG/0.5 IM SUSP: 0.5000 mL | Freq: Once | INTRAMUSCULAR | Status: DC

## 2011-12-09 MED ORDER — CEPHALEXIN 500 MG PO CAPS: 500.0000 mg | ORAL_CAPSULE | Freq: Three times a day (TID) | ORAL | Status: AC

## 2011-12-09 NOTE — Progress Notes (Signed)
  Subjective:    Patient ID: Bianca Odonnell, female    DOB: [birth year], 36 y.o.   MRN: [id number]  HPI patient was in her usual state of health until this morning when she was out cutting the grass and struck her hand underneath her lungs were. She sustained injuries to the index and middle finger of her right hand the   Review of Systems     Objective:   Physical Exam the index finger has 2 lacerations one over the flexor surface of the finger and the other just beneath the nail. There is significant swelling in this area. There is also tenderness and swelling over the flexor head of the middle finger. She has good range of motion  [facility] reading (PRIMARY) by  Dr. Cleta Alberts x-ray of the distal phalanx of the index finger shows a displaced fracture to the tuft. X-ray of the distal phalanx of the middle finger shows a nondisplaced tuft fracture.      Assessment & Plan:  Wounds to the second and third fingers of her right hand. We'll go ahead and check films done the finger that is lacerated update her Tdap

## 2011-12-09 NOTE — Progress Notes (Signed)
  Subjective:    Patient ID: Bianca Odonnell, female    DOB: [birth year], 36 y.o.   MRN: [id number]  HPI  Pt was cleaning under her lawn mower (while it was still running) and cut her R index finger.  She is having a lot of bleeding and her pain is in that finger and her middle finger.  She does not know when her last tetanus was.  She does not speak english.  She has not done anything to her wounds. Review of Systems     Objective:   Physical Exam  Vitals reviewed. Constitutional: She is oriented to person, place, and time. She appears well-developed and well-nourished.  HENT:  Head: Normocephalic and atraumatic.  Right Ear: External ear normal.  Left Ear: External ear normal.  Eyes: Conjunctivae are normal.  Pulmonary/Chest: Effort normal.  Neurological: She is alert and oriented to person, place, and time.  Skin: Skin is warm and dry.       R hand  - index finger has 2 lacerations and is bruised and very TTP - middle finger swollen and ecchymosis and TTP  Psychiatric: She has a normal mood and affect. Her behavior is normal. Judgment and thought content normal.    Procedure: consent obtained - anesthesia obtained with MC block with 1% lido and Marcaine.  Wound closed with 5-0 ethilon #6 SI sutures.  Nail removed and wound through nail bed - finger tip loose but bone fragment not able to be removed.  Nail bed closed with 5-0 vicryl #5.  Pt tolerated well.  Drsg placed with fold over splints over 2/3rd digit.      Assessment & Plan:  Recheck 2 days with Rhoderick Moody PA-C on Monday.  Do not change drsg unless there is a problem.  Expect to remove stitches in 7 days from finger tip.

## 2011-12-10 ENCOUNTER — Other Ambulatory Visit: Payer: Self-pay | Admitting: Emergency Medicine

## 2011-12-10 DIAGNOSIS — S61209A Unspecified open wound of unspecified finger without damage to nail, initial encounter: Secondary | ICD-10-CM

## 2011-12-10 MED ORDER — OXYCODONE-ACETAMINOPHEN 5-325 MG PO TABS: 1.0000 | ORAL_TABLET | Freq: Three times a day (TID) | ORAL | Status: AC | PRN

## 2011-12-12 ENCOUNTER — Ambulatory Visit (INDEPENDENT_AMBULATORY_CARE_PROVIDER_SITE_OTHER): Payer: Self-pay | Admitting: Physician Assistant

## 2011-12-12 VITALS — BP 101/67 | HR 67 | Temp 98.2°F | Resp 16

## 2011-12-12 DIAGNOSIS — S61409A Unspecified open wound of unspecified hand, initial encounter: Secondary | ICD-10-CM

## 2011-12-12 NOTE — Progress Notes (Signed)
Patient presents for recheck of wound on her right index finger. DOI 12/09/11 after cutting finger on lawn mower blade. Nail was removed and xeroform placed.  Patient states it is doing well. She denies pain, redness, drainage, fever, or chills.  She continues to wear splints on 2nd and 3rd digits.   ROS: Positive for wound of right distal phalanx.   Physical exam: wound well healing. Xeroform in place. Ecchymosis of 3rd distal phalanx. No erythema, drainage, induration, or tenderness of distal 2nd phalanx  A/P: Wound finger - recommend daily soaks in warm, soapy water for 5 minutes.  Follow up in 4 days for suture removal. Continue to wear splints

## 2011-12-17 ENCOUNTER — Ambulatory Visit (INDEPENDENT_AMBULATORY_CARE_PROVIDER_SITE_OTHER): Payer: Self-pay | Admitting: Family Medicine

## 2011-12-17 VITALS — BP 112/74 | HR 75 | Temp 98.3°F | Resp 16 | Ht 61.5 in | Wt 177.4 lb

## 2011-12-17 DIAGNOSIS — S61209A Unspecified open wound of unspecified finger without damage to nail, initial encounter: Secondary | ICD-10-CM

## 2011-12-17 NOTE — Progress Notes (Signed)
Subjective: Patient is here for recheck of the wound of her finger and to remove stick sutures.  Objective: Xeroform is still stuck to the nail bed. Cannot see the sutures all yet. Will soak it off.  Removed a Xeroform. Sutures were removed. Wound is still a little crusted and friable. There is a tiny piece of Vicryl visible in the wound and I cannot remove. I told him that once it is healed up they can trim this off. If problems return.Marland Kitchen

## 2011-12-17 NOTE — Patient Instructions (Addendum)
Keep finger clean and covered.  Return if problems

## 2014-10-31 ENCOUNTER — Ambulatory Visit: Payer: Self-pay | Attending: Internal Medicine

## 2014-11-11 ENCOUNTER — Encounter: Payer: Self-pay | Admitting: Family Medicine

## 2014-11-11 ENCOUNTER — Ambulatory Visit: Payer: Self-pay | Attending: Family Medicine | Admitting: Family Medicine

## 2014-11-11 VITALS — BP 111/73 | HR 81 | Temp 98.5°F | Resp 16 | Ht 61.5 in | Wt 202.0 lb

## 2014-11-11 DIAGNOSIS — R300 Dysuria: Secondary | ICD-10-CM | POA: Insufficient documentation

## 2014-11-11 DIAGNOSIS — N911 Secondary amenorrhea: Secondary | ICD-10-CM | POA: Insufficient documentation

## 2014-11-11 DIAGNOSIS — Z114 Encounter for screening for human immunodeficiency virus [HIV]: Secondary | ICD-10-CM

## 2014-11-11 DIAGNOSIS — N912 Amenorrhea, unspecified: Secondary | ICD-10-CM

## 2014-11-11 DIAGNOSIS — E039 Hypothyroidism, unspecified: Secondary | ICD-10-CM | POA: Insufficient documentation

## 2014-11-11 LAB — POCT URINALYSIS DIPSTICK
Bilirubin, UA: NEGATIVE
GLUCOSE UA: NEGATIVE
Ketones, UA: NEGATIVE
Leukocytes, UA: NEGATIVE
Nitrite, UA: NEGATIVE
Protein, UA: NEGATIVE
Spec Grav, UA: 1.02
UROBILINOGEN UA: 0.2
pH, UA: 5.5

## 2014-11-11 LAB — HIV ANTIBODY (ROUTINE TESTING W REFLEX): HIV 1&2 Ab, 4th Generation: NONREACTIVE

## 2014-11-11 LAB — T3, FREE: T3 FREE: 2.6 pg/mL (ref 2.3–4.2)

## 2014-11-11 LAB — TSH: TSH: 10.226 u[IU]/mL — AB (ref 0.350–4.500)

## 2014-11-11 LAB — POCT URINE PREGNANCY: Preg Test, Ur: NEGATIVE

## 2014-11-11 LAB — T4, FREE: FREE T4: 0.68 ng/dL — AB (ref 0.80–1.80)

## 2014-11-11 NOTE — Assessment & Plan Note (Signed)
Secondary amenorrhea:  Negative U preg today Checking hormone levels Will request last pap results I recommend increase exercise and low carb diet for weight loss

## 2014-11-11 NOTE — Assessment & Plan Note (Signed)
Screening HIV ordered  

## 2014-11-11 NOTE — Assessment & Plan Note (Signed)
Hypothyroidism: checking thyroid function test today. If normal will have you stop synthroid and recheck TSH in 12 weeks.

## 2014-11-11 NOTE — Progress Notes (Signed)
Establish Care Requesting TSH blood work. Stated no menses since February 2016 Complaining of burning with urination

## 2014-11-11 NOTE — Patient Instructions (Signed)
Bianca Odonnell,  Thank you for coming in today. It was a pleasure meeting you. I look forward to being your primary doctor.   1. Dysuria: normal UA, will monitor  2. Hypothyroidism: checking thyroid function test today. If normal will have you stop synthroid and recheck TSH in 12 weeks.   3.  Secondary amenorrhea:  Negative U preg today Checking hormone levels Will request last pap results I recommend increase exercise and low carb diet for weight loss   You can purchase ovulation kits over the counter   You will be called with lab results   F/u in 4-6 weeks to discuss R arm numbness   Dr. Adrian Blackwater

## 2014-11-11 NOTE — Progress Notes (Signed)
   Subjective:    Patient ID: Bianca Odonnell, female    DOB: 05-29-1975, 39 y.o.   MRN: [id number] CC: establish care, secondary amenorrhea, dysuria, requesting thyroid function test on synthroid  Spanish interpreter present  HPI  1. Amenorrhea x 5 months. No birth control since 11/2013. Was previously on OCPs. After stopping OCPs she had irregular periods. She has always had irregular periods. She had 3 children. She had her pregnancies 5 years apart each. [facility]   2. Taking synthroid: x one year. Taking 25 mcg daily. Feet swelling. Weight gain. No hot or cold sensitivity. Sometimes tired. Back aches. Denies depression. Denies nervousness or anxiety. Denies CP or palpitations.   3. Dysuria: off and one x 2 weeks. No pelvic pain, fever, chills, nausea or emesis. No vaginal discharge.   Soc Hx: non smoker Med Hx: hypothyroidism x one year  Surg Hx: no previous surgeries  Review of Systems  Constitutional: Negative for fever, chills and unexpected weight change.  Respiratory: Negative for shortness of breath.   Cardiovascular: Negative for chest pain.  Gastrointestinal: Negative for abdominal pain and blood in stool.  Genitourinary: Positive for dysuria and menstrual problem. Negative for frequency, flank pain, vaginal bleeding, vaginal discharge and vaginal pain.  Musculoskeletal: Positive for myalgias and back pain.  Skin: Negative for rash.  Allergic/Immunologic: Negative for immunocompromised state.  Psychiatric/Behavioral: Negative for suicidal ideas and dysphoric mood.        Objective:   Physical Exam BP 111/73 mmHg  Pulse 81  Temp(Src) 98.5 F (36.9 C) (Oral)  Resp 16  Ht 5' 1.5" (1.562 m)  Wt 202 lb (91.627 kg)  BMI 37.55 kg/m2  SpO2 98%  LMP 05/30/2014  Wt Readings from Last 3 Encounters:  11/11/14 202 lb (91.627 kg)  12/17/11 177 lb 6.4 oz (80.468 kg)  07/21/10 181 lb 3 oz (82.186 kg)  General appearance:  alert, cooperative, no distress and mildly obese Lungs: clear to auscultation bilaterally Heart: regular rate and rhythm, S1, S2 normal, no murmur, click, rub or gallop  Abdomen: obese, soft, NT/ND Extremities: extremities normal, atraumatic, no cyanosis or edema  Lab Results  Component Value Date   TSH 1.264 07/13/2009        Assessment & Plan:

## 2014-11-11 NOTE — Assessment & Plan Note (Signed)
Dysuria: normal UA, will monitor

## 2014-11-12 ENCOUNTER — Telehealth: Payer: Self-pay | Admitting: *Deleted

## 2014-11-12 LAB — FSH/LH
FSH: 4.8 m[IU]/mL
LH: 11.8 m[IU]/mL

## 2014-11-12 LAB — PROLACTIN: Prolactin: 12 ng/mL

## 2014-11-12 MED ORDER — LEVOTHYROXINE SODIUM 75 MCG PO TABS: 75.0000 ug | ORAL_TABLET | Freq: Every day | ORAL | Status: AC

## 2014-11-12 NOTE — Telephone Encounter (Signed)
-----   Message from Boykin Nearing, MD sent at 11/12/2014  9:06 AM EDT ----- TSH is elevated patient does have hypothyroidism and will need to increase synthroid dose to 75 mcg daily in AM with water only and at least 30 minutes before other medication, food or drink  Normal hormone levels Screening HIV is negative

## 2014-11-12 NOTE — Addendum Note (Signed)
Addended by: Boykin Nearing on: 11/12/2014 09:07 AM   Modules accepted: Orders

## 2014-11-12 NOTE — Telephone Encounter (Signed)
Pt returning call

## 2014-11-12 NOTE — Telephone Encounter (Signed)
LVM to return call.

## 2014-11-14 NOTE — Telephone Encounter (Signed)
Patient is returning phone call, please f/u with pt.

## 2014-11-19 NOTE — Telephone Encounter (Signed)
Patient came into office requesting blood work results, patient would like to be contacted in am if possible. Please f/u

## 2014-11-24 NOTE — Telephone Encounter (Signed)
Patient called to request her blood work results, patient would also like a refill on her Thyroid medications. Patient uses [facility] on [address]. Please f/u with pt.

## 2014-11-25 NOTE — Telephone Encounter (Signed)
Pt aware of results  Information given in Spanish

## 2014-11-25 NOTE — Telephone Encounter (Signed)
Please call patient regarding medication refill for thyroid and regarding results...patient has called numerous times and states it is urgent she receives a call she has ran out of her medication

## 2016-03-19 ENCOUNTER — Inpatient Hospital Stay (HOSPITAL_COMMUNITY): Payer: Self-pay

## 2016-03-19 ENCOUNTER — Encounter (HOSPITAL_COMMUNITY): Payer: Self-pay | Admitting: *Deleted

## 2016-03-19 ENCOUNTER — Inpatient Hospital Stay (HOSPITAL_COMMUNITY)
Admission: AD | Admit: 2016-03-19 | Discharge: 2016-03-19 | Disposition: A | Discharge status: Home / Self Care | Payer: Self-pay | Source: Ambulatory Visit | Attending: Obstetrics and Gynecology | Admitting: Obstetrics and Gynecology

## 2016-03-19 DIAGNOSIS — O039 Complete or unspecified spontaneous abortion without complication: Secondary | ICD-10-CM | POA: Insufficient documentation

## 2016-03-19 DIAGNOSIS — E039 Hypothyroidism, unspecified: Secondary | ICD-10-CM | POA: Insufficient documentation

## 2016-03-19 DIAGNOSIS — Z79899 Other long term (current) drug therapy: Secondary | ICD-10-CM | POA: Insufficient documentation

## 2016-03-19 DIAGNOSIS — Z9889 Other specified postprocedural states: Secondary | ICD-10-CM | POA: Insufficient documentation

## 2016-03-19 DIAGNOSIS — Z888 Allergy status to other drugs, medicaments and biological substances status: Secondary | ICD-10-CM | POA: Insufficient documentation

## 2016-03-19 DIAGNOSIS — O209 Hemorrhage in early pregnancy, unspecified: Secondary | ICD-10-CM

## 2016-03-19 HISTORY — DX: Hypothyroidism, unspecified: E03.9

## 2016-03-19 LAB — URINALYSIS, ROUTINE W REFLEX MICROSCOPIC
BILIRUBIN URINE: NEGATIVE
GLUCOSE, UA: 500 mg/dL — AB
Ketones, ur: 15 mg/dL — AB
Nitrite: POSITIVE — AB
Protein, ur: >300 mg/dL — AB
SPECIFIC GRAVITY, URINE: 1.015 (ref 1.005–1.030)
pH: 7.5 (ref 5.0–8.0)

## 2016-03-19 LAB — CBC
HCT: 35.4 % — ABNORMAL LOW (ref 36.0–46.0)
Hemoglobin: 12.3 g/dL (ref 12.0–15.0)
MCH: 29.9 pg (ref 26.0–34.0)
MCHC: 34.7 g/dL (ref 30.0–36.0)
MCV: 85.9 fL (ref 78.0–100.0)
PLATELETS: 314 10*3/uL (ref 150–400)
RBC: 4.12 MIL/uL (ref 3.87–5.11)
RDW: 12.9 % (ref 11.5–15.5)
WBC: 7.8 10*3/uL (ref 4.0–10.5)

## 2016-03-19 LAB — URINE MICROSCOPIC-ADD ON

## 2016-03-19 LAB — ABO/RH: ABO/RH(D): O POS

## 2016-03-19 LAB — HCG, QUANTITATIVE, PREGNANCY: hCG, Beta Chain, Quant, S: 5647 m[IU]/mL — ABNORMAL HIGH (ref <5)

## 2016-03-19 MED ORDER — SULFAMETHOXAZOLE-TRIMETHOPRIM 800-160 MG PO TABS: 1.0000 | ORAL_TABLET | Freq: Two times a day (BID) | ORAL | 0 refills | Status: DC

## 2016-03-19 NOTE — Discharge Instructions (Signed)
Aborto espontáneo °(Miscarriage) °El aborto espontáneo es la pérdida de un bebé que no ha nacido (feto) antes de la semana 20 del embarazo. La mayor parte de estos abortos ocurre en los primeros 3 meses. En algunos casos ocurre antes de que la mujer sepa que está embarazada. También se denomina "aborto espontáneo" o "pérdida prematura del embarazo". El aborto espontáneo puede ser una experiencia que afecte emocionalmente a la persona. Converse con su médico si tiene dudas, cómo es el proceso de duelo, y sobre planes futuros de embarazo. °CAUSAS °· Algunos problemas cromosómicos pueden hacer imposible que el bebé se desarrolle normalmente. Los problemas con los genes o cromosomas del bebé son generalmente el resultado de errores que se producen, por casualidad, cuando el embrión se divide y crece. Estos problemas no se heredan de los padres. °· Infección en el cuello del útero. °· Problemas hormonales. °· Problemas en el cuello del útero, como tener un útero incompetente. Esto ocurre cuando los tejidos no son lo suficientemente fuertes como para contener el embarazo. °· Problemas del útero, como un útero con forma anormal, los fibromas o anormalidades congénitas. °· Ciertas enfermedades crónicas. °· No fume, no beba alcohol, ni consuma drogas. °· Traumatismos °A veces, la causa es desconocida. °SÍNTOMAS °· Sangrado o manchado vaginal, con o sin cólicos o dolor. °· Dolor o cólicos en el abdomen o en la cintura. °· Eliminación de líquido, tejidos o coágulos grandes por la vagina. ° °DIAGNÓSTICO °El médico le hará un examen físico. También le indicará una ecografía para confirmar el aborto. Es posible que se realicen análisis de sangre. °TRATAMIENTO °· En algunos casos el tratamiento no es necesario, si se eliminan naturalmente todos los tejidos embrionarios que se encontraban en el útero. Si el feto o la placenta quedan dentro del útero (aborto incompleto), pueden infectarse, los tejidos que quedan pueden infectarse y  deben retirarse. Generalmente se realiza un procedimiento de dilatación y curetaje (D y C). Durante el procedimiento de dilatación y curetaje, el cuello del útero se abre (dilata) y se retira cualquier resto de tejido fetal o placentario del útero. °· Si hay una infección, le recetarán antibióticos. Podrán recetarle otros medicamentos para reducir el tamaño del útero (contraerlo) si hay una mucho sangrado. °· Si su sangre es Rh negativa y su bebé es Rh positivo, usted necesitará la inyección de inmunoglobulina Rh. Esta inyección protegerá a los futuros bebés de tener problemas de compatibilidad Rh en futuros embarazos. ° °INSTRUCCIONES PARA EL CUIDADO EN EL HOGAR °· El médico le indicará reposo en cama o le permitirá realizar actividades livianas. Vuelva a la actividad lentamente o según las indicaciones de su médico. °· Pídale a alguien que la ayude con las responsabilidades familiares y del hogar durante este tiempo. °· Lleve un registro de la cantidad y la saturación de las toallas higiénicas que utiliza cada día. Anote esta información °· No use tampones. No No se haga duchas vaginales ni tenga relaciones sexuales hasta que el médico la autorice. °· Sólo tome medicamentos de venta libre o recetados para calmar el dolor o el malestar, según las indicaciones de su médico. °· No tome aspirina. La aspirina puede ocasionar hemorragias. °· Concurra puntualmente a las citas de control con el médico. °· Si usted o su pareja tienen dificultades con el duelo, hable con su médico para buscar la ayuda psicológica que los ayude a enfrentar la pérdida del embarazo. Permítase el tiempo suficiente de duelo antes de quedar embarazada nuevamente. ° °SOLICITE ATENCIÓN MÉDICA DE INMEDIATO SI: °·   intensos o dolor en la espalda o en el abdomen.  Tiene fiebre.  Elimina grandes cogulos de [place] (del tamao de una nuez o ms) o tejidos por la vagina. Guarde lo que ha eliminado para que su mdico lo examine.  La  hemorragia aumenta.  Margette Fast secrecin vaginal espesa y con mal olor.  Se siente mareada, dbil, o se desmaya.  Siente escalofros. ASEGRESE DE QUE:  Comprende estas instrucciones.  Controlar su enfermedad.  Solicitar ayuda de inmediato si no mejora o si empeora. Esta informacin no tiene Marine scientist el consejo del mdico. Asegrese de hacerle al mdico cualquier pregunta que tenga. Document Released: 01/19/2005 Document Revised: 08/06/2012 Document Reviewed: 05/31/2011 Elsevier Interactive Patient Education  2017 [place] (Pelvic Rest) CUNDO SE RECOMIENDA EL REPOSO PLVICO? El reposo plvico puede recomendarse en los siguientes casos:  La placenta cubre de forma parcial o total la abertura del cuello del tero (placenta previa).  Hay sangrado entre la pared del tero y el saco amnitico en el primer trimestre de Media planner (hemorragia subcorinica).  El [place] de parto comienza muy pronto (trabajo de parto prematuro). Segn la salud general de la madre y el feto, el mdico decidir si el reposo plvico es [place]. [place]? Durante el tiempo que le indique el mdico:  No tenga relaciones sexuales, estimulacin sexual ni orgasmos.  No use tampones. No se haga duchas vaginales. No se introduzca nada en la vagina.  No levante ningn objeto que pese ms de 10libras (4,5kg).  Evite las actividades que demanden mucho esfuerzo (extenuantes).  Evite las actividades que requieran esfuerzos de los msculos de la pelvis. [place]? Solicite atencin mdica de inmediato si:  Tiene clicos en la zona inferior del abdomen.  Tiene secrecin de flujo vaginal.  Tiene un dolor sordo en la parte baja de la espalda.  Tiene contracciones regulares.  Tienen tensin uterina. [place]? Solicite atencin mdica de inmediato si:  Tiene sangrado vaginal y est  embarazada. Esta informacin no tiene Marine scientist el consejo del mdico. Asegrese de hacerle al mdico cualquier pregunta que tenga. Document Released: 01/04/2012 Document Revised: 08/03/2015 Document Reviewed: 10/13/2014 Elsevier Interactive Patient Education  2017 [employer].

## 2016-03-19 NOTE — MAU Note (Addendum)
11/15 Was told preg at health dept.  Neg preg test in Sept at [facility] dr. Buddy Duty bleeding a wk ago, passed a large clot, felt  Like contractions this morning. Passed some more clots.  Denies pain at this time

## 2016-03-19 NOTE — MAU Provider Note (Signed)
History     CSN: [id number]  Arrival date and time: 03/19/16 1021   First Provider Initiated Contact with Patient 03/19/16 1157      Chief Complaint  Patient presents with  . Vaginal Bleeding  . Threatened Miscarriage   HPI  Bianca Odonnell is a 41 y.o. G4P3 at [redacted]w[redacted]d by LMP who presents with vaginal bleeding. Was seen at [facility] last week for pregnancy confirmation. States she told them she was bleeding and they reportedly told her it was "normal to have periods during pregnancy". Pt states bleeding has continued and worsened today. Now passing several large clots & saturating pads. Endorses lower abdominal cramping that describes as period like cramps. Rates pain 2/10. Has not treated. Denies fever or chills. Has negative upt in September when she was seen by PCP for hypothyroidism.   Spanish interpreter at bedside  OB History    Gravida Para Term Preterm AB Living   4         3   SAB TAB Ectopic Multiple Live Births           3      Past Medical History:  Diagnosis Date  . Hypothyroidism     Past Surgical History:  Procedure Laterality Date  . CESAREAN SECTION      No family history on file.  Social History  Substance Use Topics  . Smoking status: Never Smoker  . Smokeless tobacco: Not on file  . Alcohol use Not on file    Allergies: No Known Allergies  Facility-Administered Medications Prior to Admission  Medication Dose Route Frequency Provider Last Rate Last Dose  . TDaP (BOOSTRIX) injection 0.5 mL  0.5 mL Intramuscular Once Mancel Bale, PA-C       Prescriptions Prior to Admission  Medication Sig Dispense Refill Last Dose  . levothyroxine (SYNTHROID, LEVOTHROID) 75 MCG tablet Take 1 tablet (75 mcg total) by mouth daily before breakfast. 30 tablet 2 03/18/2016 at Unknown time  . Prenatal Vit-Fe Fumarate-FA (MULTIVITAMIN-PRENATAL) 27-0.8 MG TABS tablet Take 1 tablet by mouth daily at 12 noon.   03/18/2016 at Unknown time    Review of Systems   Constitutional: Negative.   Gastrointestinal: Positive for abdominal pain. Negative for constipation, diarrhea, nausea and vomiting.  Genitourinary: Negative for dysuria.       + vaginal bleeding   Physical Exam   Blood pressure 116/79, pulse 104, temperature 98.1 F (36.7 C), temperature source Oral, resp. rate 18, height 5\' 1"  (1.549 m), weight 196 lb (88.9 kg), last menstrual period 01/24/2016.  Physical Exam  Nursing note and vitals reviewed. Constitutional: She is oriented to person, place, and time. She appears well-developed and well-nourished. No distress.  HENT:  Head: Normocephalic and atraumatic.  Eyes: Conjunctivae are normal. Right eye exhibits no discharge. Left eye exhibits no discharge. No scleral icterus.  Neck: Normal range of motion.  Cardiovascular: Normal rate, regular rhythm and normal heart sounds.   No murmur heard. Respiratory: Effort normal and breath sounds normal. No respiratory distress. She has no wheezes.  GI: Soft. She exhibits no distension. There is no tenderness.  Genitourinary: Uterus normal. Right adnexum displays no mass and no tenderness. Left adnexum displays no mass and no tenderness. There is bleeding in the vagina.  Genitourinary Comments: Moderate amount of dark red blood. Several large clots removed from canal. Pink/grey tissue ~1 cm diameter removed from os.   Neurological: She is alert and oriented to person, place, and time.  Skin: Skin is warm  and dry. She is not diaphoretic.  Psychiatric: She has a normal mood and affect. Her behavior is normal. Judgment and thought content normal.    MAU Course  Procedures Results for orders placed or performed during the hospital encounter of 03/19/16 (from the past 24 hour(s))  CBC     Status: Abnormal   Collection Time: 03/19/16 11:08 AM  Result Value Ref Range   WBC 7.8 4.0 - 10.5 K/uL   RBC 4.12 3.87 - 5.11 MIL/uL   Hemoglobin 12.3 12.0 - 15.0 g/dL   HCT 35.4 (L) 36.0 - 46.0 %   MCV 85.9  78.0 - 100.0 fL   MCH 29.9 26.0 - 34.0 pg   MCHC 34.7 30.0 - 36.0 g/dL   RDW 12.9 11.5 - 15.5 %   Platelets 314 150 - 400 K/uL  ABO/Rh     Status: None   Collection Time: 03/19/16 11:08 AM  Result Value Ref Range   ABO/RH(D) O POS   hCG, quantitative, pregnancy     Status: Abnormal   Collection Time: 03/19/16 11:08 AM  Result Value Ref Range   hCG, Beta Chain, Quant, S 5,647 (H) <5 mIU/mL  Urinalysis, Routine w reflex microscopic (not at [facility])     Status: Abnormal   Collection Time: 03/19/16 11:23 AM  Result Value Ref Range   Color, Urine RED (A) YELLOW   APPearance CLOUDY (A) CLEAR   Specific Gravity, Urine 1.015 1.005 - 1.030   pH 7.5 5.0 - 8.0   Glucose, UA 500 (A) NEGATIVE mg/dL   Hgb urine dipstick LARGE (A) NEGATIVE   Bilirubin Urine NEGATIVE NEGATIVE   Ketones, ur 15 (A) NEGATIVE mg/dL   Protein, ur >300 (A) NEGATIVE mg/dL   Nitrite POSITIVE (A) NEGATIVE   Leukocytes, UA LARGE (A) NEGATIVE  Urine microscopic-add on     Status: Abnormal   Collection Time: 03/19/16 11:23 AM  Result Value Ref Range   Squamous Epithelial / LPF 0-5 (A) NONE SEEN   WBC, UA 0-5 0 - 5 WBC/hpf   RBC / HPF TOO NUMEROUS TO COUNT 0 - 5 RBC/hpf   Bacteria, UA FEW (A) NONE SEEN   Urine-Other      LESS THAN 2 ML SAMPLE,MICROSCOPIC PERFORMED ON UNCONCENTRATED SAMPLE   US Ob Comp Less 14 Wks  Result Date: 03/19/2016 CLINICAL DATA:  40 year old pregnant female presents with 1 week of vaginal bleeding. Quantitative beta HCG 5,647. EXAM: OBSTETRIC <14 WK [place] AND TRANSVAGINAL OB US TECHNIQUE: Both transabdominal and transvaginal ultrasound examinations were performed for complete evaluation of the gestation as well as the maternal uterus, adnexal regions, and pelvic cul-de-sac. Transvaginal technique was performed to assess early pregnancy. COMPARISON:  No prior scans from this gestation. FINDINGS: Anteverted uterus measures 11.5 x 6.6 x 5.6 cm in size. No uterine fibroids or other myometrial abnormalities  are demonstrated. Endometrium is thickened up to 26 mm and is heterogeneous with mobile blood products in the lower endometrial cavity. No gestational sac or focal mass is demonstrated in the endometrial cavity. Right ovary measures 3.5 x 1.8 x 1.5 cm. Left ovary is seen only on the transabdominal sequence and measures 3.5 x 1.9 x 2.8 cm. No suspicious ovarian or adnexal masses are demonstrated. No abnormal free fluid in the pelvis. IMPRESSION: Non-localization of the pregnancy on this scan. Thickened heterogeneous endometrium with mobile blood products in the lower endometrial cavity. No intrauterine gestational sac. No abnormal ovarian or adnexal masses. Given the beta HCG level greater than 2000, differential  diagnosis includes spontaneous abortion in progress or occult ectopic gestation. Recommend close clinical follow-up and serial serum beta HCG monitoring, with repeat obstetric scan as warranted by beta HCG levels and clinical assessment. Electronically Signed   By: Ilona Sorrel M.D.   On: 03/19/2016 12:48   US Ob Transvaginal  Result Date: 03/19/2016 CLINICAL DATA:  40 year old pregnant female presents with 1 week of vaginal bleeding. Quantitative beta HCG 5,647. EXAM: OBSTETRIC <14 WK [place] AND TRANSVAGINAL OB US TECHNIQUE: Both transabdominal and transvaginal ultrasound examinations were performed for complete evaluation of the gestation as well as the maternal uterus, adnexal regions, and pelvic cul-de-sac. Transvaginal technique was performed to assess early pregnancy. COMPARISON:  No prior scans from this gestation. FINDINGS: Anteverted uterus measures 11.5 x 6.6 x 5.6 cm in size. No uterine fibroids or other myometrial abnormalities are demonstrated. Endometrium is thickened up to 26 mm and is heterogeneous with mobile blood products in the lower endometrial cavity. No gestational sac or focal mass is demonstrated in the endometrial cavity. Right ovary measures 3.5 x 1.8 x 1.5 cm. Left ovary is seen  only on the transabdominal sequence and measures 3.5 x 1.9 x 2.8 cm. No suspicious ovarian or adnexal masses are demonstrated. No abnormal free fluid in the pelvis. IMPRESSION: Non-localization of the pregnancy on this scan. Thickened heterogeneous endometrium with mobile blood products in the lower endometrial cavity. No intrauterine gestational sac. No abnormal ovarian or adnexal masses. Given the beta HCG level greater than 2000, differential diagnosis includes spontaneous abortion in progress or occult ectopic gestation. Recommend close clinical follow-up and serial serum beta HCG monitoring, with repeat obstetric scan as warranted by beta HCG levels and clinical assessment. Electronically Signed   By: Ilona Sorrel M.D.   On: 03/19/2016 12:48    MDM CBC, abo/rh, BHCG, ultrasound Tissue removed during pelvic exam sent to pathology Ultrasound shows no IUP; BHCG >5000; likely that what I obtained during pelvic exam was POC but will have pt return in 48 hrs for bhcg to confirm that it is dropping appropriately for SAB Bleeding decreased after exam & removal of tissue Assessment and Plan  A; 1. Miscarriage   2. Vaginal bleeding in pregnancy, first trimester    P; Discharge home Return to MAU Monday for bhcg Take ibuprofen prn pain Pelvic rest Discussed reasons to return to [facility] 03/19/2016, 12:09 PM

## 2016-03-20 LAB — CULTURE, OB URINE: Culture: NO GROWTH

## 2016-03-21 ENCOUNTER — Inpatient Hospital Stay (HOSPITAL_COMMUNITY)
Admission: AD | Admit: 2016-03-21 | Discharge: 2016-03-21 | Disposition: A | Discharge status: Home / Self Care | Payer: Self-pay | Source: Ambulatory Visit | Attending: Obstetrics and Gynecology | Admitting: Obstetrics and Gynecology

## 2016-03-21 DIAGNOSIS — O039 Complete or unspecified spontaneous abortion without complication: Secondary | ICD-10-CM

## 2016-03-21 DIAGNOSIS — Z3A08 8 weeks gestation of pregnancy: Secondary | ICD-10-CM | POA: Insufficient documentation

## 2016-03-21 DIAGNOSIS — Z3491 Encounter for supervision of normal pregnancy, unspecified, first trimester: Secondary | ICD-10-CM | POA: Insufficient documentation

## 2016-03-21 LAB — HCG, QUANTITATIVE, PREGNANCY: hCG, Beta Chain, Quant, S: 893 m[IU]/mL — ABNORMAL HIGH (ref <5)

## 2016-03-21 NOTE — MAU Provider Note (Signed)
Ms. Bianca Odonnell  is a 40 y.o. G4P0  at [redacted]w[redacted]d who presents to MAU today for follow-up quant hCG. The patient denies abdominal pain, vaginal bleeding, N/V or fever.   BP 124/65   Pulse 76   Temp 98.7 F (37.1 C)   Resp 18   LMP 01/24/2016 Comment: states last period was 26mon ago  GENERAL: Well-developed, well-nourished female in no acute distress.  HEENT: Normocephalic, atraumatic.   LUNGS: Effort normal HEART: Regular rate  SKIN: Warm, dry and without erythema PSYCH: Normal mood and affect  Results for KHARI, GILLOOLY (MRN [id number]) as of 03/21/2016 19:28  Ref. Range 03/19/2016 11:08 03/19/2016 11:23 03/19/2016 12:33 03/21/2016 18:19  HCG, Beta Chain, Quant, S Latest Ref Range: <5 mIU/mL 5,647 (H)   893 (H)    A: SAB  P: Discharge home Bleeding precautions discussed Patient advised to follow-up with [facility] in 1 week for repeat labs. They will call with an appointment time.  Patient may return to MAU as needed or if her condition were to change or worsen   Luvenia Redden, PA-C  03/21/2016 6:54 PM

## 2016-03-21 NOTE — Discharge Instructions (Signed)
Aborto espontáneo °(Miscarriage) °El aborto espontáneo es la pérdida de un bebé que no ha nacido.(feto) antes de la semana 20 del embarazo. La causa generalmente es desconocida. °CUIDADOS EN EL HOGAR °· Debe permanecer en cama (reposo en cama) o podrá hacer actividades livianas. Regrese a sus actividades según las indicaciones del médico. °· Pida ayuda con las tareas domésticas. °· Anote cuántos apósitos usa por día. Describa el grado en que están empapados. °· No use tampones. No se higienice la vagina (duchas vaginales) ni tenga relaciones sexuales (coito) hasta que el médico la autorice. °· Sólo debe tomar la medicación según las indicaciones del médico. °· No tome aspirina. °· Cumpla con los controles médicos según las indicaciones. °· Si usted o su pareja tienen problemas con el duelo, hable con su médico. También puede intentar con psicoterapia. Permítase el tiempo suficiente de duelo antes de quedar embarazada nuevamente. ° °SOLICITE AYUDA DE INMEDIATO SI: °· Siente cólicos intensos o dolor en el estómago, en la espalda o en el vientre (abdomen). °· Tiene fiebre. °· Elimina grumos de sangre (coágulos) por la vagina, que tienen el tamaño de una nuez o más. Guarde los coágulos para que el médico los vea. °· Elimina gran cantidad de tejidos por la vagina. Guarde lo que ha eliminado para que su médico lo examine. °· Aumenta el sangrado. °· Observa una secreción espesa, con mal olor (pérdida) que proviene de la vagina. °· Se siente mareada, débil o se desvanece (se desmaya). °· Siente escalofríos. ° °ASEGÚRESE DE QUE: °· Comprende estas instrucciones. °· Controlará su enfermedad. °· Solicitará ayuda de inmediato si no mejora o si empeora. ° °Esta información no tiene como fin reemplazar el consejo del médico. Asegúrese de hacerle al médico cualquier pregunta que tenga. °Document Released: 10/11/2011 Document Revised: 10/11/2011 Document Reviewed: 05/12/2011 °Elsevier Interactive Patient Education © 2017 Elsevier  Inc. ° °

## 2016-03-21 NOTE — MAU Note (Signed)
Pt presents to MAU for repeat QUANT. Denies pain, reports vaginal bleeding with clots

## 2016-04-01 ENCOUNTER — Other Ambulatory Visit: Payer: Self-pay

## 2016-04-01 DIAGNOSIS — O3680X0 Pregnancy with inconclusive fetal viability, not applicable or unspecified: Secondary | ICD-10-CM

## 2016-04-02 LAB — HCG, QUANTITATIVE, PREGNANCY: hCG, Beta Chain, Quant, S: 52.3 m[IU]/mL — ABNORMAL HIGH

## 2016-04-06 ENCOUNTER — Telehealth: Payer: Self-pay | Admitting: General Practice

## 2016-04-06 ENCOUNTER — Other Ambulatory Visit: Payer: Self-pay

## 2016-04-06 DIAGNOSIS — R399 Unspecified symptoms and signs involving the genitourinary system: Secondary | ICD-10-CM

## 2016-04-06 DIAGNOSIS — O039 Complete or unspecified spontaneous abortion without complication: Secondary | ICD-10-CM

## 2016-04-06 NOTE — Telephone Encounter (Signed)
Per Dr Harolyn Rutherford, patient needs to come this week for bhcg level. Called patient with Bianca Odonnell for interpreter and informed patient of results & recommended lab draw this week. Patient verbalized understanding and states she can come today at 3. Patient states the last person really hurt her when getting her blood and it doesn't seem like she knew what she was doing. Told patient if she would like someone else can draw her blood when she comes in, she just needs to let us know. Patient verbalized understanding and states she had a bladder infection when she lost her baby and doesn't know if it is gone or not. Told patient we can do a urine culture when she comes in today to see if it is gone. Patient verbalized understanding and had no questions

## 2016-04-07 ENCOUNTER — Telehealth: Payer: Self-pay | Admitting: *Deleted

## 2016-04-07 LAB — HCG, QUANTITATIVE, PREGNANCY: hCG, Beta Chain, Quant, S: 24.9 m[IU]/mL — ABNORMAL HIGH

## 2016-04-07 LAB — URINE CULTURE: Organism ID, Bacteria: NO GROWTH

## 2016-04-07 NOTE — Telephone Encounter (Signed)
Called patient using [employer] Interpreter number [phone] Spanish. Patient informed of results. She will come on 04/13/16 3pm . Pt had no further questions.

## 2016-04-07 NOTE — Telephone Encounter (Signed)
-----   Message from Truett Mainland, DO sent at 04/07/2016  1:52 PM EST ----- F/u bHCG in 1 week

## 2016-04-12 ENCOUNTER — Other Ambulatory Visit: Payer: Self-pay

## 2016-04-12 DIAGNOSIS — O039 Complete or unspecified spontaneous abortion without complication: Secondary | ICD-10-CM

## 2016-04-13 ENCOUNTER — Other Ambulatory Visit: Payer: Self-pay

## 2016-04-13 LAB — HCG, QUANTITATIVE, PREGNANCY: hCG, Beta Chain, Quant, S: 7.1 m[IU]/mL — ABNORMAL HIGH

## 2016-09-07 ENCOUNTER — Encounter: Payer: Self-pay | Admitting: Family Medicine

## 2016-10-20 ENCOUNTER — Encounter (HOSPITAL_COMMUNITY): Payer: Self-pay | Admitting: *Deleted

## 2016-10-20 ENCOUNTER — Inpatient Hospital Stay (HOSPITAL_COMMUNITY)
Admission: AD | Admit: 2016-10-20 | Discharge: 2016-10-20 | Disposition: A | Discharge status: Home / Self Care | Payer: Self-pay | Source: Ambulatory Visit | Attending: Obstetrics & Gynecology | Admitting: Obstetrics & Gynecology

## 2016-10-20 ENCOUNTER — Inpatient Hospital Stay (HOSPITAL_COMMUNITY): Payer: Self-pay

## 2016-10-20 DIAGNOSIS — O209 Hemorrhage in early pregnancy, unspecified: Secondary | ICD-10-CM

## 2016-10-20 DIAGNOSIS — O09511 Supervision of elderly primigravida, first trimester: Secondary | ICD-10-CM | POA: Insufficient documentation

## 2016-10-20 DIAGNOSIS — M549 Dorsalgia, unspecified: Secondary | ICD-10-CM | POA: Insufficient documentation

## 2016-10-20 DIAGNOSIS — Z3A00 Weeks of gestation of pregnancy not specified: Secondary | ICD-10-CM | POA: Insufficient documentation

## 2016-10-20 DIAGNOSIS — O26891 Other specified pregnancy related conditions, first trimester: Secondary | ICD-10-CM | POA: Insufficient documentation

## 2016-10-20 DIAGNOSIS — N760 Acute vaginitis: Secondary | ICD-10-CM

## 2016-10-20 DIAGNOSIS — B9689 Other specified bacterial agents as the cause of diseases classified elsewhere: Secondary | ICD-10-CM

## 2016-10-20 DIAGNOSIS — O3680X0 Pregnancy with inconclusive fetal viability, not applicable or unspecified: Secondary | ICD-10-CM | POA: Insufficient documentation

## 2016-10-20 LAB — CBC
HCT: 37.4 % (ref 36.0–46.0)
HEMOGLOBIN: 13 g/dL (ref 12.0–15.0)
MCH: 29.7 pg (ref 26.0–34.0)
MCHC: 34.8 g/dL (ref 30.0–36.0)
MCV: 85.4 fL (ref 78.0–100.0)
Platelets: 295 10*3/uL (ref 150–400)
RBC: 4.38 MIL/uL (ref 3.87–5.11)
RDW: 12.9 % (ref 11.5–15.5)
WBC: 8.7 10*3/uL (ref 4.0–10.5)

## 2016-10-20 LAB — URINALYSIS, ROUTINE W REFLEX MICROSCOPIC
Bilirubin Urine: NEGATIVE
GLUCOSE, UA: NEGATIVE mg/dL
Ketones, ur: NEGATIVE mg/dL
Nitrite: NEGATIVE
PH: 6 (ref 5.0–8.0)
Protein, ur: NEGATIVE mg/dL
SPECIFIC GRAVITY, URINE: 1.006 (ref 1.005–1.030)

## 2016-10-20 LAB — WET PREP, GENITAL
SPERM: NONE SEEN
Trich, Wet Prep: NONE SEEN
Yeast Wet Prep HPF POC: NONE SEEN

## 2016-10-20 LAB — HCG, QUANTITATIVE, PREGNANCY: hCG, Beta Chain, Quant, S: 884 m[IU]/mL — ABNORMAL HIGH (ref <5)

## 2016-10-20 MED ORDER — METRONIDAZOLE 500 MG PO TABS: 500.0000 mg | ORAL_TABLET | Freq: Three times a day (TID) | ORAL | 0 refills | Status: AC

## 2016-10-20 NOTE — MAU Note (Addendum)
Pt states she sees blood with wiping, started on Saturday.  Having lower back pain for the past month.  Pos HPT , also pos UPT @ [facility] on June 21, pt has documentation.

## 2016-10-20 NOTE — Discharge Instructions (Signed)
Embarazo ectpico (Ectopic Pregnancy) Un embarazo ectpico ocurre cuando un vulo fecundado se desarrolla fuera del tero. Un embarazo no puede subsistir fuera del tero. Este problema generalmente ocurre en las trompas de [place]. La causa es, con frecuencia, un dao en la trompa de [place]. Si el problema se detecta a tiempo, puede tratarse con medicamentos. Si el conducto se fisura o estalla (hay ruptura), tendr Ardelia Mems hemorragia interna. Esto es Engineer, maintenance (IT). Deber someterse a [place]. Solicite ayuda de inmediato. SNTOMAS Al principio puede tener sntomas de un embarazo normal. Estos pueden ser:  Falta del perodo menstrual.  Ganas de vomitar (nuseas).  Sensacin de cansancio.  [place] mamas. Luego podr a comenzar a tener sntomas que no son normales. Estos pueden ser:  Dolor durante el coito (relacin sexual).  Hemorragia por la vagina. Esto incluye el sangrado leve (prdidas).  [place] o dolor en el vientre (abdomen) o en la zona inferior del vientre. Estos pueden sentirse en uno de los lados.  Latidos cardacos rpidos (pulso).  Perder la conciencia (desmayarse) despus de ir de cuerpo (defecar). Si el conducto se rompe, puede tener sntomas como:  Dolor muy intenso en el vientre o parte baja del vientre. Esto se produce repentinamente.  Mareos.  Desmayo.  Dolor en el hombro. SOLICITE AYUDA DE INMEDIATO SI: Tiene alguno de estos sntomas. Esto es Engineer, maintenance (IT). ASEGRESE DE QUE:  Comprende estas instrucciones.  Controlar su afeccin.  Recibir ayuda de inmediato si no mejora o si empeora.  Esta informacin no tiene Marine scientist el consejo del mdico. Asegrese de hacerle al mdico cualquier pregunta que tenga. Document Released: 03/31/2011 Document Revised: 04/16/2013 Document Reviewed: 11/21/2012 Elsevier Interactive Patient Education  2017 [employer].

## 2016-10-20 NOTE — MAU Provider Note (Signed)
Patient Bianca Odonnell is a 41 y.o. G5P0 at [redacted]w[redacted]d here with complaints of pink bleeding when she wipes. She has no other complaints other than back pain when she walks. She denies dysuria, flank pain, abnormal discharge or suprapubic pain.  History     CSN: [id number]  Arrival date and time: 10/20/16 1044   First Provider Initiated Contact with Patient 10/20/16 1323      Chief Complaint  Patient presents with  . Back Pain   Vaginal Bleeding  The patient's primary symptoms include vaginal bleeding. This is a new problem. The current episode started in the past 7 days. The problem occurs 2 to 4 times per day. The problem has been unchanged. The patient is experiencing no pain. Associated symptoms include back pain. Pertinent negatives include no abdominal pain, constipation, diarrhea, nausea or vomiting. The vaginal discharge was normal. She has not been passing clots. She has not been passing tissue. Nothing aggravates the symptoms. She has tried nothing for the symptoms.    OB History    Gravida Para Term Preterm AB Living   5         3   SAB TAB Ectopic Multiple Live Births           3      Past Medical History:  Diagnosis Date  . Hypothyroidism     Past Surgical History:  Procedure Laterality Date  . CESAREAN SECTION      No family history on file.  Social History  Substance Use Topics  . Smoking status: Never Smoker  . Smokeless tobacco: Not on file  . Alcohol use Not on file    Allergies: No Known Allergies  Prescriptions Prior to Admission  Medication Sig Dispense Refill Last Dose  . levothyroxine (SYNTHROID, LEVOTHROID) 75 MCG tablet Take 1 tablet (75 mcg total) by mouth daily before breakfast. 30 tablet 2 03/18/2016 at Unknown time  . Prenatal Vit-Fe Fumarate-FA (MULTIVITAMIN-PRENATAL) 27-0.8 MG TABS tablet Take 1 tablet by mouth daily at 12 noon.   03/18/2016 at Unknown time  . sulfamethoxazole-trimethoprim (BACTRIM DS,SEPTRA DS) 800-160 MG tablet  Take 1 tablet by mouth 2 (two) times daily. 6 tablet 0     Review of Systems  Gastrointestinal: Negative for abdominal pain, constipation, diarrhea, nausea and vomiting.  Genitourinary: Positive for vaginal bleeding.  Musculoskeletal: Positive for back pain.   Physical Exam   Blood pressure 117/63, pulse 81, temperature 98.6 F (37 C), temperature source Oral, resp. rate 18, height 5\' 1"  (1.549 m), weight 201 lb (91.2 kg), last menstrual period 08/27/2016, unknown if currently breastfeeding.  Physical Exam  Constitutional: She is oriented to Odonnell, place, and time. She appears well-developed and well-nourished.  HENT:  Head: Normocephalic.  Neck: Normal range of motion.  Cardiovascular: Normal rate.   Respiratory: Effort normal.  GI: Soft. Bowel sounds are normal.  Genitourinary: Vagina normal.  Musculoskeletal: Normal range of motion.  Neurological: She is alert and oriented to Odonnell, place, and time. She has normal reflexes.  Skin: Skin is warm and dry.  Psychiatric: She has a normal mood and affect.    MAU Course  Procedures  MDM Wet prep: positive for clue Beta: 884 CBC:  US Ob Comp Less 14 Wks  Result Date: 10/20/2016 CLINICAL DATA:  Vaginal bleeding in first trimester pregnancy. Gestational age by LMP of 7 weeks 5 days. EXAM: OBSTETRIC <14 WK [place] AND TRANSVAGINAL OB US TECHNIQUE: Both transabdominal and transvaginal ultrasound examinations were performed for complete evaluation of the  gestation as well as the maternal uterus, adnexal regions, and pelvic cul-de-sac. Transvaginal technique was performed to assess early pregnancy. COMPARISON:  None. FINDINGS: Intrauterine gestational sac: None Maternal uterus/adnexae: No uterine fibroids identified. Both ovaries are normal in appearance. No mass or abnormal free fluid identified. IMPRESSION: No intrauterine gestational sac or adnexal mass identified, consistent with pregnancy of unknown anatomic location. Differential  diagnosis includes recent spontaneous abortion, IUP too early to visualize, and non-visualized ectopic pregnancy. Recommend follow up of quantitative B-HCG levels, and follow up US as clinically warranted. Electronically Signed   By: Earle Gell M.D.   On: 10/20/2016 14:46   US Ob Transvaginal  Result Date: 10/20/2016 CLINICAL DATA:  Vaginal bleeding in first trimester pregnancy. Gestational age by LMP of 7 weeks 5 days. EXAM: OBSTETRIC <14 WK [place] AND TRANSVAGINAL OB US TECHNIQUE: Both transabdominal and transvaginal ultrasound examinations were performed for complete evaluation of the gestation as well as the maternal uterus, adnexal regions, and pelvic cul-de-sac. Transvaginal technique was performed to assess early pregnancy. COMPARISON:  None. FINDINGS: Intrauterine gestational sac: None Maternal uterus/adnexae: No uterine fibroids identified. Both ovaries are normal in appearance. No mass or abnormal free fluid identified. IMPRESSION: No intrauterine gestational sac or adnexal mass identified, consistent with pregnancy of unknown anatomic location. Differential diagnosis includes recent spontaneous abortion, IUP too early to visualize, and non-visualized ectopic pregnancy. Recommend follow up of quantitative B-HCG levels, and follow up US as clinically warranted. Electronically Signed   By: Earle Gell M.D.   On: 10/20/2016 14:46     Assessment and Plan   1. Pregnancy of unknown anatomic location   2. Vaginal bleeding in pregnancy, first trimester    3.  Explained to patient the result of her [place] (recent miscarriage vs. Early pregnant vs. Ectopic).  4.Patient to return for follow up beta in 2 days and [place] in 10 days.  5. Strict ectopic precautions given. 6. RX for flagyl sent to pharmacy. Mervyn Skeeters  10/20/2016, 1:32 PM

## 2016-10-21 LAB — GC/CHLAMYDIA PROBE AMP (~~LOC~~) NOT AT ARMC
Chlamydia: NEGATIVE
Neisseria Gonorrhea: NEGATIVE

## 2016-10-22 ENCOUNTER — Inpatient Hospital Stay (HOSPITAL_COMMUNITY)
Admission: AD | Admit: 2016-10-22 | Discharge: 2016-10-22 | Disposition: A | Discharge status: Home / Self Care | Payer: Self-pay | Source: Ambulatory Visit | Attending: Family Medicine | Admitting: Family Medicine

## 2016-10-22 ENCOUNTER — Encounter (HOSPITAL_COMMUNITY): Payer: Self-pay

## 2016-10-22 DIAGNOSIS — O209 Hemorrhage in early pregnancy, unspecified: Secondary | ICD-10-CM

## 2016-10-22 DIAGNOSIS — O3680X0 Pregnancy with inconclusive fetal viability, not applicable or unspecified: Secondary | ICD-10-CM

## 2016-10-22 DIAGNOSIS — O26891 Other specified pregnancy related conditions, first trimester: Secondary | ICD-10-CM | POA: Insufficient documentation

## 2016-10-22 DIAGNOSIS — Z3A08 8 weeks gestation of pregnancy: Secondary | ICD-10-CM | POA: Insufficient documentation

## 2016-10-22 LAB — HCG, QUANTITATIVE, PREGNANCY: hCG, Beta Chain, Quant, S: 793 m[IU]/mL — ABNORMAL HIGH (ref <5)

## 2016-10-22 NOTE — MAU Note (Signed)
Patient presents for repeat HCG, sees vaginal bleeding when going to the bathroom wearing a pad, no pain

## 2016-10-22 NOTE — MAU Provider Note (Signed)
History   [id number]  @ [redacted] wks gestation by LMP in for repeat quant today. Denies pain but has sm amt vaginal bleeding.  CSN: [id number]  Arrival date & time 10/22/16  1223   None     Chief Complaint  Patient presents with  . Follow-up    HPI  Past Medical History:  Diagnosis Date  . Hypothyroidism     Past Surgical History:  Procedure Laterality Date  . CESAREAN SECTION      No family history on file.  Social History  Substance Use Topics  . Smoking status: Never Smoker  . Smokeless tobacco: Not on file  . Alcohol use Not on file    OB History    Gravida Para Term Preterm AB Living   8 3 3   1 3    SAB TAB Ectopic Multiple Live Births   1       3      Review of Systems  Constitutional: Negative.   HENT: Negative.   Eyes: Negative.   Respiratory: Negative.   Cardiovascular: Negative.   Gastrointestinal: Negative.   Endocrine: Negative.   Genitourinary: Positive for vaginal bleeding.  Musculoskeletal: Negative.   Skin: Negative.   Allergic/Immunologic: Negative.   Neurological: Negative.   Hematological: Negative.   Psychiatric/Behavioral: Negative.     Allergies  Patient has no known allergies.  Home Medications   Current Outpatient Rx  . Order #: [id number] Class: Normal  . Order #: [id number] Class: Normal  . Order #: [id number] Class: Historical Med    BP 130/71 (BP Location: Right Arm)   Pulse 83   Temp 98.6 F (37 C)   Resp 18   LMP 08/27/2016   Physical Exam  Constitutional: She is oriented to person, place, and time. She appears well-developed and well-nourished.  HENT:  Head: Normocephalic.  Pulmonary/Chest: Effort normal.  Neurological: She is alert and oriented to person, place, and time.  Skin: Skin is warm and dry.  Psychiatric: She has a normal mood and affect. Her behavior is normal. Judgment and thought content normal.    MAU Course  Procedures (including critical care time)  Labs Reviewed  HCG, QUANTITATIVE, PREGNANCY -  Abnormal; Notable for the following:       Result Value   hCG, Beta Chain, Quant, S 793 (*)    All other components within normal limits   No results found.   1. Pregnancy of unknown anatomic location       MDM  After lengthy discussion with pt in regards to treatment options pt ops to have repeat quant in two days in clinic. Pt verbalizes understanding. Ectopic precautions given. Pt d/c home.

## 2016-10-24 ENCOUNTER — Other Ambulatory Visit: Payer: Self-pay | Admitting: Advanced Practice Midwife

## 2016-10-24 DIAGNOSIS — O3680X0 Pregnancy with inconclusive fetal viability, not applicable or unspecified: Secondary | ICD-10-CM

## 2016-10-24 DIAGNOSIS — O039 Complete or unspecified spontaneous abortion without complication: Secondary | ICD-10-CM

## 2016-10-24 LAB — HCG, QUANTITATIVE, PREGNANCY: hCG, Beta Chain, Quant, S: 90 m[IU]/mL — ABNORMAL HIGH (ref <5)

## 2016-10-24 NOTE — Progress Notes (Unsigned)
Subjective:     Patient ID: Bianca Odonnell, female   DOB: 1976/04/04, 41 y.o.   MRN: [id number]  HPI; F/U quant for pregnancy of unknown anatomic location.    Review of Systems     Objective:   Physical Exam     Assessment:    1. Pregnancy of unknown anatomic location *** - hCG, quantitative, pregnancy  2. Miscarriage ***     Plan:     Tamala Julian, [place], [place] 10/24/2016 10:54 AM

## 2016-10-24 NOTE — Progress Notes (Unsigned)
Patient here for stat bhcg today. Patient reports that bleeding has increased to menstrual type bleeding. Denies pain. Informed patient to wait in lobby for results & update plan of care. Patient verbalizes understanding.

## 2016-10-24 NOTE — Patient Instructions (Signed)
Aborto espontáneo °(Miscarriage) °El aborto espontáneo es la pérdida de un bebé que no ha nacido (feto) antes de la semana 20 del embarazo. La mayor parte de estos abortos ocurre en los primeros 3 meses. En algunos casos ocurre antes de que la mujer sepa que está embarazada. También se denomina "aborto espontáneo" o "pérdida prematura del embarazo". El aborto espontáneo puede ser una experiencia que afecte emocionalmente a la persona. Converse con su médico si tiene dudas, cómo es el proceso de duelo, y sobre planes futuros de embarazo. °CAUSAS °· Algunos problemas cromosómicos pueden hacer imposible que el bebé se desarrolle normalmente. Los problemas con los genes o cromosomas del bebé son generalmente el resultado de errores que se producen, por casualidad, cuando el embrión se divide y crece. Estos problemas no se heredan de los padres. °· Infección en el cuello del útero. °· Problemas hormonales. °· Problemas en el cuello del útero, como tener un útero incompetente. Esto ocurre cuando los tejidos no son lo suficientemente fuertes como para contener el embarazo. °· Problemas del útero, como un útero con forma anormal, los fibromas o anormalidades congénitas. °· Ciertas enfermedades crónicas. °· No fume, no beba alcohol, ni consuma drogas. °· Traumatismos °A veces, la causa es desconocida. °SÍNTOMAS °· Sangrado o manchado vaginal, con o sin cólicos o dolor. °· Dolor o cólicos en el abdomen o en la cintura. °· Eliminación de líquido, tejidos o coágulos grandes por la vagina. ° °DIAGNÓSTICO °El médico le hará un examen físico. También le indicará una ecografía para confirmar el aborto. Es posible que se realicen análisis de sangre. °TRATAMIENTO °· En algunos casos el tratamiento no es necesario, si se eliminan naturalmente todos los tejidos embrionarios que se encontraban en el útero. Si el feto o la placenta quedan dentro del útero (aborto incompleto), pueden infectarse, los tejidos que quedan pueden infectarse y  deben retirarse. Generalmente se realiza un procedimiento de dilatación y curetaje (D y C). Durante el procedimiento de dilatación y curetaje, el cuello del útero se abre (dilata) y se retira cualquier resto de tejido fetal o placentario del útero. °· Si hay una infección, le recetarán antibióticos. Podrán recetarle otros medicamentos para reducir el tamaño del útero (contraerlo) si hay una mucho sangrado. °· Si su sangre es Rh negativa y su bebé es Rh positivo, usted necesitará la inyección de inmunoglobulina Rh. Esta inyección protegerá a los futuros bebés de tener problemas de compatibilidad Rh en futuros embarazos. ° °INSTRUCCIONES PARA EL CUIDADO EN EL HOGAR °· El médico le indicará reposo en cama o le permitirá realizar actividades livianas. Vuelva a la actividad lentamente o según las indicaciones de su médico. °· Pídale a alguien que la ayude con las responsabilidades familiares y del hogar durante este tiempo. °· Lleve un registro de la cantidad y la saturación de las toallas higiénicas que utiliza cada día. Anote esta información °· No use tampones. No No se haga duchas vaginales ni tenga relaciones sexuales hasta que el médico la autorice. °· Sólo tome medicamentos de venta libre o recetados para calmar el dolor o el malestar, según las indicaciones de su médico. °· No tome aspirina. La aspirina puede ocasionar hemorragias. °· Concurra puntualmente a las citas de control con el médico. °· Si usted o su pareja tienen dificultades con el duelo, hable con su médico para buscar la ayuda psicológica que los ayude a enfrentar la pérdida del embarazo. Permítase el tiempo suficiente de duelo antes de quedar embarazada nuevamente. ° °SOLICITE ATENCIÓN MÉDICA DE INMEDIATO SI: °·   Siente calambres intensos o dolor en la espalda o en el abdomen. °· Tiene fiebre. °· Elimina grandes coágulos de sangre (del tamaño de una nuez o más) o tejidos por la vagina. Guarde lo que ha eliminado para que su médico lo examine. °· La  hemorragia aumenta. °· Observa una secreción vaginal espesa y con mal olor. °· Se siente mareada, débil, o se desmaya. °· Siente escalofríos. ° °ASEGÚRESE DE QUE: °· Comprende estas instrucciones. °· Controlará su enfermedad. °· Solicitará ayuda de inmediato si no mejora o si empeora. ° °Esta información no tiene como fin reemplazar el consejo del médico. Asegúrese de hacerle al médico cualquier pregunta que tenga. °Document Released: 01/19/2005 Document Revised: 08/06/2012 Document Reviewed: 05/31/2011 °Elsevier Interactive Patient Education © 2017 Elsevier Inc. ° °

## 2016-10-31 ENCOUNTER — Other Ambulatory Visit: Payer: Self-pay

## 2016-10-31 DIAGNOSIS — O039 Complete or unspecified spontaneous abortion without complication: Secondary | ICD-10-CM

## 2016-11-01 LAB — BETA HCG QUANT (REF LAB): hCG Quant: 2 m[IU]/mL

## 2016-11-07 ENCOUNTER — Telehealth: Payer: Self-pay

## 2016-11-07 NOTE — Telephone Encounter (Signed)
No further quants needed. SAB complete  CALLED PATIENT LEFT A MESSAGE FOR HER TO CALL us BACK REGARDING HORMONE LEVEL.

## 2016-11-14 ENCOUNTER — Ambulatory Visit: Payer: Self-pay | Admitting: Certified Nurse Midwife

## 2016-11-23 NOTE — Telephone Encounter (Signed)
Call patient no answer or voice mail. Patient Bianca Odonnell was a 2 three weeks ago.

## 2017-08-26 ENCOUNTER — Encounter (HOSPITAL_COMMUNITY): Payer: Self-pay

## 2018-06-18 ENCOUNTER — Other Ambulatory Visit: Payer: Self-pay | Admitting: Medical

## 2018-06-18 DIAGNOSIS — Z124 Encounter for screening for malignant neoplasm of cervix: Secondary | ICD-10-CM

## 2018-06-18 NOTE — Addendum Note (Signed)
Addended by: Armond Hang on: 06/18/2018 08:17 PM   Modules accepted: Orders

## 2018-06-18 NOTE — Progress Notes (Signed)
Patient: Bianca Odonnell           Date of Birth: 09-26-1975           MRN: [id number] Visit Date: 06/18/2018 PCP: Boykin Nearing, MD  Cervical Cancer Screening Do you smoke?: No Have you ever had or been told you have an allergy to latex products?: No Marital status: Married Date of last pap smear: 2-5 yrs ago Date of last menstrual period: 05/27/18 Number of pregnancies: 5 Number of births: 3 Have you ever had any of the following? Hysterectomy: No Tubal ligation (tubes tied): No Abnormal bleeding: No Abnormal pap smear: Yes Venereal warts: No A sex partner with venereal warts: No A high risk* sex partner: No  Cervical Exam  Abnormal Observations: Normal exam Recommendations: Await results, follow up as indicated. G5, P3 with 2 spontaneous abortions        Patient's History Patient Active Problem List   Diagnosis Date Noted  . Hypothyroidism 11/11/2014  . 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 Screening for HIV (human immunodeficiency virus) 11/11/2014  . Dysuria 11/11/2014  . LIPOMA 07/21/2010  . HEMORRHOIDS 07/21/2010  . DENTAL CARIES 07/21/2010   Past Medical History:  Diagnosis Date  . Hypothyroidism     No family history on file.  Past Surgical History:  Procedure Laterality Date  . CESAREAN SECTION     Social History   Occupational History  . Not on file  Tobacco Use  . Smoking status: Never Smoker  Substance and Sexual Activity  . Alcohol use: Not on file  . Drug use: Not on file  . Sexual activity: Not on file

## 2018-06-21 LAB — CYTOLOGY - PAP
Adequacy: ABSENT
Diagnosis: NEGATIVE

## 2018-07-23 ENCOUNTER — Telehealth (HOSPITAL_COMMUNITY): Payer: Self-pay | Admitting: *Deleted

## 2018-07-23 NOTE — Telephone Encounter (Signed)
Normal Pap smear result letter mailed to patient by Cytology.

## 2019-05-24 ENCOUNTER — Other Ambulatory Visit: Payer: Self-pay | Admitting: Family Medicine

## 2019-05-24 ENCOUNTER — Other Ambulatory Visit: Payer: Self-pay

## 2019-05-24 DIAGNOSIS — Z1231 Encounter for screening mammogram for malignant neoplasm of breast: Secondary | ICD-10-CM

## 2019-05-28 ENCOUNTER — Other Ambulatory Visit: Payer: Self-pay | Admitting: Nurse Practitioner

## 2019-05-28 DIAGNOSIS — Z1231 Encounter for screening mammogram for malignant neoplasm of breast: Secondary | ICD-10-CM

## 2019-07-09 ENCOUNTER — Ambulatory Visit
Admission: RE | Admit: 2019-07-09 | Discharge: 2019-07-09 | Disposition: A | Discharge status: Home / Self Care | Payer: No Typology Code available for payment source | Source: Ambulatory Visit | Attending: Nurse Practitioner | Admitting: Nurse Practitioner

## 2019-07-09 ENCOUNTER — Other Ambulatory Visit: Payer: Self-pay

## 2019-07-09 DIAGNOSIS — Z1231 Encounter for screening mammogram for malignant neoplasm of breast: Secondary | ICD-10-CM

## 2019-07-22 ENCOUNTER — Other Ambulatory Visit: Payer: Self-pay

## 2019-07-22 DIAGNOSIS — N6310 Unspecified lump in the right breast, unspecified quadrant: Secondary | ICD-10-CM

## 2019-07-25 ENCOUNTER — Other Ambulatory Visit: Payer: Self-pay

## 2019-07-25 ENCOUNTER — Encounter: Payer: Self-pay | Admitting: Women's Health

## 2019-07-25 ENCOUNTER — Ambulatory Visit: Payer: Self-pay | Admitting: Women's Health

## 2019-07-25 VITALS — BP 110/76 | Temp 98.4°F | Wt 208.0 lb

## 2019-07-25 DIAGNOSIS — Z1239 Encounter for other screening for malignant neoplasm of breast: Secondary | ICD-10-CM

## 2019-07-25 NOTE — Patient Instructions (Signed)
Autoexamen de [employer] Breast Self-Awareness Autoexaminarse las mamas significa familiarizarse con el aspecto y la sensacin de las mamas al tacto. Incluye revisarse las mamas habitualmente e informarle al mdico acerca de cualquier cambio. Es importante autoexaminarse las [place]. En ocasiones, los cambios pueden no ser perjudiciales (son benignos), pero a veces un cambio en las mamas puede ser un signo de un problema mdico grave. Es importante aprender a Teacher, music procedimiento de modo correcto para que pueda [facility] problemas de [place] temprana, cuando es ms probable que el tratamiento resulte exitoso. Todas las mujeres deben autoexaminarse las [place], incluso aquellas que se sometieron a implantes mamarios. Lo que necesita:  Un espejo.  Una habitacin bien iluminada. Cmo realizar el autoexamen de mamas Un autoexamen de mamas es una forma de aprender qu es normal para sus mamas y si sufren modificaciones. Para hacer un autoexamen de las mamas: Busque cambios  1. Qutese toda la ropa por encima de la cintura. 2. Prese frente a un espejo en una habitacin con buena iluminacin. 3. Apoye las manos en las caderas. 4. Empuje con fuerza hacia abajo con las manos. 5. [place] en el espejo. Busque diferencias entre ellas (asimetra), por ejemplo: ? Diferencias en la forma. ? Diferencias en el tamao. ? Pliegues, depresiones y ndulos en [place], y no en la otra. 6. Observe cada mama para buscar cambios en la piel, por ejemplo: ? Enrojecimiento. ? Zonas escamosas. 7. Observe si hay cambios en los pezones, por ejemplo: ? Secrecin. ? [place]. ? Hoyuelos. ? Enrojecimiento. ? Un cambio en la posicin. [place] si hay cambios [place] las mamas con cuidado para detectar ndulos y [place]. Lo mejor es hacerlo mientras est acostada boca arriba en el piso y nuevamente mientras est sentada o de pie en la ducha o la baera con agua jabonosa en la piel. Plpese cada mama de la  siguiente forma: 1. Coloque el brazo del lado de la mama que se examina por arriba de la cabeza. 2. Plpese la mama con la [place]. 3. Comience en la zona del pezn y haga crculos superpuestos de de pulgada (2cm). Para hacerlo, use las yemas de los tres dedos del [place]. Ejerza una presin [place], luego mediana y [place]. La presin Industrial/product designer el tejido ms cercano a la piel. La presin mediana le permitir palpar el tejido que est un poco ms profundo. La presin Advertising account executive el tejido ms cercano a las costillas. 4. Continuar superponiendo crculos y vaya hacia abajo, hasta sentir las [place], por debajo del [place]. 5. Desplcese a una distancia del ancho de un dedo hacia el centro del cuerpo. Siga con los crculos superpuestos de de pulgada (2cm) para palpar la mama, mientras asciende lentamente hacia la clavcula. 6. Contine con el examen hacia arriba y [place] abajo con las tres presiones, Librarian, academic a Insurance risk surveyor.  Anote sus hallazgos Anotar lo que encuentra puede ayudarla a recordar qu debe consultar con el mdico. Beclabito los siguientes datos:  Qu es normal para cada mama.  Cualquier cambio que encuentre en cada mama, por ejemplo: ? La clase de cambios que encuentra. ? Dolor o sensibilidad. ? Si hay bultos, su tamao y [place].  En qu momento se encuentra del ciclo menstrual, si usted todava est menstruando. Recomendaciones y consejos generales  Examnese las [employer].  Si est amamantando, el mejor momento para examinarse las mamas es despus de Economist o de usar un Engineer, petroleum.  Liana Gerold,  el mejor momento para examinarse las mamas es 5 a 7das despus del perodo menstrual. Durante el perodo menstrual, las mamas en general tienen ms bultos, y tal vez sea ms difcil percibir los [place].  Con el tiempo y Designer, jewellery, se familiarizar con las variaciones de las mamas y se sentir ms cmoda con Warden/ranger. Comunquese con un mdico si:  Observa un cambio en la forma o el tamao de las mamas o los pezones.  Observa un cambio en la piel de las mamas o los pezones, como la piel enrojecida o escamosa.  Tiene una secrecin anormal proveniente de los pezones.  Encuentra un ndulo o una zona engrosada que no tena antes.  Tiene dolor en las [place].  Tiene alguna inquietud relacionada con la salud de la mama. Resumen  El autoexamen de mamas incluye buscar cambios fsicos en las [place], y tambin palpar para Actuary cambio en las mamas.  El autoexamen de mamas debe hacerse frente a un espejo en una habitacin bien iluminada.  Debe examinarse las [employer]. Si menstra, el mejor momento para examinarse las [place] es de 5 a 7das despus del perodo menstrual.  Informe al mdico si nota cambios en las mamas, como cambios en el tamao, cambios en la piel, dolor o sensibilidad, o un lquido inusual que sale de los pezones. Esta informacin no tiene Marine scientist el consejo del mdico. Asegrese de hacerle al mdico cualquier pregunta que tenga. Document Revised: 01/09/2018 Document Reviewed: 01/09/2018 Elsevier Patient Education  [place].

## 2019-07-25 NOTE — Progress Notes (Signed)
Ms. Bianca Odonnell is a 44 y.o. female who presents to [facility] clinic today with no complaints.    Pap Smear: Pap not smear completed today. Last Pap smear was 06/18/2018 at [facility] clinic and was normal. Per patient has history of an abnormal Pap smear. Last Pap smear result is available in Epic.   Physical exam: Breasts Breasts symmetrical. No skin abnormalities bilateral breasts. No nipple retraction bilateral breasts. No nipple discharge bilateral breasts. No lymphadenopathy. No lumps palpated bilateral breasts.       Pelvic/Bimanual Pap is not indicated today    Smoking History: Patient has never smoked not referred to quit line.    Patient Navigation: Patient education provided. Access to services provided for patient through [facility] program. Spanish interpreter provided. No transportation provided   Colorectal Cancer Screening: Per patient has never had colonoscopy completed No complaints today.    Breast and Cervical Cancer Risk Assessment: Patient does not have family history of breast cancer, known genetic mutations, or radiation treatment to the chest before age 41. Patient does not have history of cervical dysplasia, immunocompromised, or DES exposure in-utero.  Risk Assessment    Risk Scores      07/25/2019   Last edited by: Demetrius Revel, LPN   5-year risk: 0.4 %   Lifetime risk: 5.7 %          A: BCCCP exam without pap smear Complaint of none.  P: Referred patient to the [facility] for a diagnostic mammogram. Appointment scheduled 08/01/2019 for f/u on possible mass found on screening mammogram.  , Gerrie Nordmann, NP 07/25/2019 1:21 PM

## 2019-08-01 ENCOUNTER — Other Ambulatory Visit: Payer: Self-pay

## 2019-08-01 ENCOUNTER — Ambulatory Visit
Admission: RE | Admit: 2019-08-01 | Discharge: 2019-08-01 | Disposition: A | Discharge status: Home / Self Care | Payer: No Typology Code available for payment source | Source: Ambulatory Visit | Attending: Obstetrics and Gynecology | Admitting: Obstetrics and Gynecology

## 2019-08-01 DIAGNOSIS — N6310 Unspecified lump in the right breast, unspecified quadrant: Secondary | ICD-10-CM

## 2020-09-23 ENCOUNTER — Other Ambulatory Visit: Payer: Self-pay | Admitting: Obstetrics and Gynecology

## 2020-09-23 DIAGNOSIS — Z1231 Encounter for screening mammogram for malignant neoplasm of breast: Secondary | ICD-10-CM

## 2020-11-05 ENCOUNTER — Ambulatory Visit: Payer: Self-pay | Admitting: *Deleted

## 2020-11-05 ENCOUNTER — Encounter (INDEPENDENT_AMBULATORY_CARE_PROVIDER_SITE_OTHER): Payer: Self-pay

## 2020-11-05 ENCOUNTER — Other Ambulatory Visit: Payer: Self-pay

## 2020-11-05 ENCOUNTER — Ambulatory Visit: Payer: Self-pay

## 2020-11-05 VITALS — BP 122/78 | Wt 209.5 lb

## 2020-11-05 DIAGNOSIS — Z1239 Encounter for other screening for malignant neoplasm of breast: Secondary | ICD-10-CM

## 2020-11-05 DIAGNOSIS — N6320 Unspecified lump in the left breast, unspecified quadrant: Secondary | ICD-10-CM

## 2020-11-05 DIAGNOSIS — N6321 Unspecified lump in the left breast, upper outer quadrant: Secondary | ICD-10-CM

## 2020-11-05 NOTE — Progress Notes (Signed)
Ms. Britanni Yarde is a 45 y.o. female who presents to [facility] clinic today with no complaints.    Pap Smear: Pap smear not completed today. Last Pap smear was 06/18/2018 at the free cervical cancer screening clinic and was normal. Per patient has history of an abnormal Pap smear in 2000 that a colposcopy and LEEP were completed for follow-up. Patient stated all Pap smears have been normal since LEEP and has had at least three normal Pap smears. Last Pap smear result is available in Epic.   Physical exam: Breasts Breasts symmetrical. No skin abnormalities bilateral breasts. No nipple retraction bilateral breasts. No nipple discharge bilateral breasts. No lymphadenopathy. No lumps palpated right breast. Palpated a lump within the left axilla at 2 o'clock 15 cm from the nipple. No complaints of pain or tenderness on exam.  MS DIGITAL SCREENING TOMO BILATERAL  Result Date: 07/09/2019 CLINICAL DATA:  Screening. EXAM: DIGITAL SCREENING BILATERAL MAMMOGRAM WITH TOMO AND CAD COMPARISON:  None. ACR Breast Density Category c: The breast tissue is heterogeneously dense, which may obscure small masses. FINDINGS: In the right breast, a possible mass warrants further evaluation. In the left breast, no findings suspicious for malignancy. Images were processed with CAD. IMPRESSION: Further evaluation is suggested for possible mass in the right breast. RECOMMENDATION: Diagnostic mammogram and possibly ultrasound of the right breast. (Code:[id number]-33M) The patient will be contacted regarding the findings, and additional imaging will be scheduled. BI-RADS CATEGORY  0: Incomplete. Need additional imaging evaluation and/or prior mammograms for comparison. Electronically Signed   By: Ammie Ferrier M.D.   On: 07/09/2019 09:32   MS DIGITAL DIAG TOMO UNI RIGHT  Result Date: 08/01/2019 CLINICAL DATA:  Patient was called back for a right breast mass. EXAM: DIGITAL DIAGNOSTIC RIGHT MAMMOGRAM WITH TOMO ULTRASOUND RIGHT  BREAST COMPARISON:  Previous exam(s). ACR Breast Density Category c: The breast tissue is heterogeneously dense, which may obscure small masses. FINDINGS: The mass in the lateral right breast persists on additional imaging. No other suspicious findings. On physical exam, no suspicious lumps are identified. Targeted ultrasound is performed, showing fibrocystic changes in the lateral right breast accounting for the mammographic findings. IMPRESSION: Fibrocystic changes.  No evidence of malignancy. RECOMMENDATION: Annual screening mammography. I have discussed the findings and recommendations with the patient. If applicable, a reminder letter will be sent to the patient regarding the next appointment. BI-RADS CATEGORY  2: Benign. Electronically Signed   By: Dorise Bullion III M.D   On: 08/01/2019 10:56     Pelvic/Bimanual Pap is not indicated today per BCCCP guidelines.   Smoking History: Patient has never smoked.   Patient Navigation: Patient education provided. Access to services provided for patient through [place] program. Spanish interpreter Rudene Anda from [facility] provided.    Breast and Cervical Cancer Risk Assessment: Patient does not have family history of breast cancer, known genetic mutations, or radiation treatment to the chest before age 36. Patient does not have history of cervical dysplasia, immunocompromised, or DES exposure in-utero.  Risk Assessment     Risk Scores       11/05/2020 07/25/2019   Last edited by: Royston Bake, CMA [place], Sherie Mamie Nick, LPN   5-year risk: 0.4 % 0.4 %   Lifetime risk: 5.6 % 5.7 %            A: BCCCP exam without pap smear No complaints.  P: Referred patient to the [facility] for a diagnostic mammogram. Appointment scheduled Thursday, November 19, 2020 at  [id number] Parish, RN 11/05/2020 10:04 AM

## 2020-11-05 NOTE — Patient Instructions (Signed)
Explained breast self awareness with Brayton Mars. Patient did not need a Pap smear today due to last Pap smear was 06/18/2018. Let her know BCCCP will cover Pap smears every 3 years unless has a history of abnormal Pap smears. Referred patient to the [facility] for a diagnostic mammogram. Appointment scheduled Thursday, November 19, 2020 at 1010. Patient aware of appointment and will be there. Negin Hegg verbalized understanding.  , Arvil Chaco, RN 10:05 AM

## 2020-11-19 ENCOUNTER — Ambulatory Visit
Admission: RE | Admit: 2020-11-19 | Discharge: 2020-11-19 | Disposition: A | Discharge status: Home / Self Care | Payer: No Typology Code available for payment source | Source: Ambulatory Visit | Attending: Obstetrics and Gynecology | Admitting: Obstetrics and Gynecology

## 2020-11-19 ENCOUNTER — Other Ambulatory Visit: Payer: Self-pay

## 2020-11-19 DIAGNOSIS — N6320 Unspecified lump in the left breast, unspecified quadrant: Secondary | ICD-10-CM

## 2021-06-09 ENCOUNTER — Other Ambulatory Visit: Payer: Self-pay | Admitting: *Deleted

## 2021-06-09 ENCOUNTER — Encounter (INDEPENDENT_AMBULATORY_CARE_PROVIDER_SITE_OTHER): Payer: Self-pay

## 2021-06-09 ENCOUNTER — Other Ambulatory Visit: Payer: Self-pay

## 2021-06-09 DIAGNOSIS — Z124 Encounter for screening for malignant neoplasm of cervix: Secondary | ICD-10-CM

## 2021-06-09 NOTE — Progress Notes (Signed)
Patient: Bianca Odonnell           Date of Birth: [birth year]           MRN: [id number] Visit Date: 06/09/2021 PCP: Patient, No Pcp Per (Inactive)  Cervical Cancer Screening Do you smoke?: No Have you ever had or been told you have an allergy to latex products?: No Marital status: Married Date of last pap smear: 2-5 yrs ago (06/18/2018-Negative ([facility] Screening)) Date of last menstrual period: 06/01/21 Number of pregnancies: 5 Number of births: 3 Have you ever had any of the following? 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 Jodi Mourning)) Venereal warts: No A sex partner with venereal warts: No A high risk* sex partner: No  Cervical Exam  Abnormal Observations: Normal Exam. Recommendations: Last Pap smear was 06/18/2018 at the free cervical cancer screening clinic and was normal. Per patient has history of an abnormal Pap smear in 2000 that a colposcopy and LEEP were completed for follow-up. Patient stated all Pap smears have been normal since LEEP and has had at least three normal Pap smears. Last Pap smear result is available in Epic. Let patient know if today's Pap smear is normal and HPV negative that her next Pap smear is due in 5 years due to her abnormal Pap smear was over 20 years ago. Informed patient that will follow up with her within the next couple of weeks with results of her Pap smear by phone. Patient verbalized understanding.   Spanish interpreter Rudene Anda from [facility] provider.  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  Social History   Occupational History   Not on file  Tobacco Use   Smoking status: Never   Smokeless tobacco: Never  Vaping Use   Vaping Use: Never used  Substance and Sexual Activity   Alcohol use: Not Currently   Drug use: Never   Sexual activity: Yes    Birth control/protection: None

## 2021-06-11 ENCOUNTER — Other Ambulatory Visit: Payer: Self-pay

## 2021-06-11 LAB — CYTOLOGY - PAP
Comment: NEGATIVE
Diagnosis: NEGATIVE
Diagnosis: REACTIVE
High risk HPV: NEGATIVE

## 2021-06-15 ENCOUNTER — Telehealth: Payer: Self-pay

## 2021-06-15 NOTE — Telephone Encounter (Signed)
Via Lavon Paganini, Spanish Interpreter [facility]), Patient informed pap results ASC-US/HPV-negative, repeat pap in 3 years. Patient verbalized understanding.

## 2021-11-11 ENCOUNTER — Other Ambulatory Visit: Payer: Self-pay

## 2021-11-11 DIAGNOSIS — Z1231 Encounter for screening mammogram for malignant neoplasm of breast: Secondary | ICD-10-CM

## 2021-12-28 ENCOUNTER — Ambulatory Visit: Payer: Self-pay | Admitting: Hematology and Oncology

## 2021-12-28 ENCOUNTER — Ambulatory Visit
Admission: RE | Admit: 2021-12-28 | Discharge: 2021-12-28 | Disposition: A | Discharge status: Home / Self Care | Payer: No Typology Code available for payment source | Source: Ambulatory Visit | Attending: Obstetrics and Gynecology | Admitting: Obstetrics and Gynecology

## 2021-12-28 VITALS — BP 140/88 | Wt 210.2 lb

## 2021-12-28 DIAGNOSIS — Z1231 Encounter for screening mammogram for malignant neoplasm of breast: Secondary | ICD-10-CM

## 2021-12-28 DIAGNOSIS — Z1211 Encounter for screening for malignant neoplasm of colon: Secondary | ICD-10-CM

## 2021-12-28 NOTE — Progress Notes (Signed)
Ms. Bianca Odonnell is a 46 y.o. female who presents to [facility] clinic today with no complaints .    Pap Smear: Pap not smear completed today. Last Pap smear was 2022 at [facility] clinic and was normal. Per patient has no history of an abnormal Pap smear. Last Pap smear result is not available in Epic.   Physical exam: Breasts Breasts symmetrical. No skin abnormalities bilateral breasts. No nipple retraction bilateral breasts. No nipple discharge bilateral breasts. No lymphadenopathy. No lumps palpated bilateral breasts.     MS DIGITAL DIAG TOMO BILAT  Result Date: 11/19/2020 CLINICAL DATA:  Palpable lump within the LEFT axilla. Patient describes recurrent axillary cysts with drainage. EXAM: DIGITAL DIAGNOSTIC BILATERAL MAMMOGRAM WITH TOMOSYNTHESIS AND CAD; [place] AXILLARY LEFT TECHNIQUE: Bilateral digital diagnostic mammography and breast tomosynthesis was performed. The images were evaluated with computer-aided detection.; Targeted ultrasound examination of the left axilla was performed. COMPARISON:  Previous exam(s). ACR Breast Density Category c: The breast tissue is heterogeneously dense, which may obscure small masses. FINDINGS: There are no new dominant masses, suspicious calcifications or secondary signs of malignancy within either breast. Focal asymmetry is seen within the LEFT axilla, corresponding to the palpable area of concern, with overlying skin marker in place. On physical exam, there is no skin redness in the LEFT axilla (patient states that there was skin redness earlier but now resolved). Targeted ultrasound is performed, showing a complex collection underlying the skin of the LEFT axilla, measuring 1.1 x 0.4 x 0.8 cm, with tract to the skin, compatible with complicated sebaceous cyst. Deeper soft tissues of the LEFT axilla are unremarkable. No additional mass or enlarged lymph nodes are seen. IMPRESSION: 1. Complicated sebaceous cyst within the superficial soft tissues of the LEFT axilla,  measuring 1.1 cm, corresponding to the palpable area of concern. 2. No evidence of malignancy within either breast. RECOMMENDATION: 1. Patient was reassured that sebaceous cysts are benign findings and typically self-limiting. Patient was informed that warm compresses can expedite healing. The patient was instructed to return if the area that she feels becomes larger or firmer to palpation. Patient was encouraged to follow-up with referring physician if any associated skin redness, skin warmth, or pain developed at the site as surgical consultation might then be needed for definitive treatment. 2.  Screening mammogram in one year.(Code:[id number]) I have discussed the findings and recommendations with the patient with the aid of an interpreter. If applicable, a reminder letter will be sent to the patient regarding the next appointment. BI-RADS CATEGORY  2: Benign. Electronically Signed   By: Franki Cabot M.D.   On: 11/19/2020 11:09   MS DIGITAL DIAG TOMO UNI RIGHT  Result Date: 08/01/2019 CLINICAL DATA:  Patient was called back for a right breast mass. EXAM: DIGITAL DIAGNOSTIC RIGHT MAMMOGRAM WITH TOMO ULTRASOUND RIGHT BREAST COMPARISON:  Previous exam(s). ACR Breast Density Category c: The breast tissue is heterogeneously dense, which may obscure small masses. FINDINGS: The mass in the lateral right breast persists on additional imaging. No other suspicious findings. On physical exam, no suspicious lumps are identified. Targeted ultrasound is performed, showing fibrocystic changes in the lateral right breast accounting for the mammographic findings. IMPRESSION: Fibrocystic changes.  No evidence of malignancy. RECOMMENDATION: Annual screening mammography. I have discussed the findings and recommendations with the patient. If applicable, a reminder letter will be sent to the patient regarding the next appointment. BI-RADS CATEGORY  2: Benign. Electronically Signed   By: Dorise Bullion III M.D   On: 08/01/2019  10:56   MS DIGITAL SCREENING TOMO BILATERAL  Result Date: 07/09/2019 CLINICAL DATA:  Screening. EXAM: DIGITAL SCREENING BILATERAL MAMMOGRAM WITH TOMO AND CAD COMPARISON:  None. ACR Breast Density Category c: The breast tissue is heterogeneously dense, which may obscure small masses. FINDINGS: In the right breast, a possible mass warrants further evaluation. In the left breast, no findings suspicious for malignancy. Images were processed with CAD. IMPRESSION: Further evaluation is suggested for possible mass in the right breast. RECOMMENDATION: Diagnostic mammogram and possibly ultrasound of the right breast. (Code:[id number]) The patient will be contacted regarding the findings, and additional imaging will be scheduled. BI-RADS CATEGORY  0: Incomplete. Need additional imaging evaluation and/or prior mammograms for comparison. Electronically Signed   By: Ammie Ferrier M.D.   On: 07/09/2019 09:32      Pelvic/Bimanual Pap is not indicated today    Smoking History: Patient has never smoked and was not referred to quit line.    Patient Navigation: Patient education provided. Access to services provided for patient through [place] interpreter provided. No transportation provided   Colorectal Cancer Screening: Per patient has never had colonoscopy completed No complaints today. FIT test given.   Breast and Cervical Cancer Risk Assessment: Patient does not have family history of breast cancer, known genetic mutations, or radiation treatment to the chest before age 41. Patient does not have history of cervical dysplasia, immunocompromised, or DES exposure in-utero.  Risk Assessment     Risk Scores       12/28/2021 11/05/2020   Last edited by: Royston Bake, CMA Royston Bake, CMA   5-year risk: 0.5 % 0.4 %   Lifetime risk: 5.6 % 5.6 %            A: BCCCP exam without pap smear No complaints. Benign exam.  P: Referred patient to the [facility] for a screening mammogram. Appointment scheduled 12/28/2021.  Melodye Ped, NP 12/28/2021 10:43 AM
